# Patient Record
Sex: Female | Born: 1970 | Race: Black or African American | Hispanic: No | Marital: Single | Smoking: Current every day smoker
Health system: Southern US, Community
[De-identification: ages and names within clinical notes are randomized; demographics above are authoritative.]

## PROBLEM LIST (undated history)

## (undated) DIAGNOSIS — F419 Anxiety disorder, unspecified: Secondary | ICD-10-CM

## (undated) DIAGNOSIS — F32A Depression, unspecified: Secondary | ICD-10-CM

## (undated) DIAGNOSIS — F319 Bipolar disorder, unspecified: Secondary | ICD-10-CM

## (undated) DIAGNOSIS — F17200 Nicotine dependence, unspecified, uncomplicated: Secondary | ICD-10-CM

## (undated) DIAGNOSIS — Z8719 Personal history of other diseases of the digestive system: Secondary | ICD-10-CM

## (undated) DIAGNOSIS — I10 Essential (primary) hypertension: Secondary | ICD-10-CM

## (undated) DIAGNOSIS — F329 Major depressive disorder, single episode, unspecified: Secondary | ICD-10-CM

## (undated) DIAGNOSIS — Z9889 Other specified postprocedural states: Secondary | ICD-10-CM

## (undated) DIAGNOSIS — F29 Unspecified psychosis not due to a substance or known physiological condition: Secondary | ICD-10-CM

## (undated) DIAGNOSIS — F22 Delusional disorders: Secondary | ICD-10-CM

## (undated) HISTORY — PX: CHOLECYSTECTOMY: SHX55

## (undated) HISTORY — PX: TONSILLECTOMY: SUR1361

## (undated) HISTORY — PX: HERNIA REPAIR: SHX51

## (undated) HISTORY — PX: ANKLE SURGERY: SHX546

## (undated) HISTORY — PX: TUBAL LIGATION: SHX77

---

## 2010-07-30 ENCOUNTER — Emergency Department (HOSPITAL_COMMUNITY)
Admission: EM | Admit: 2010-07-30 | Discharge: 2010-07-30 | Disposition: A | Discharge status: Home / Self Care | Payer: Medicaid - Out of State | Attending: Emergency Medicine | Admitting: Emergency Medicine

## 2010-07-30 DIAGNOSIS — R109 Unspecified abdominal pain: Secondary | ICD-10-CM | POA: Insufficient documentation

## 2010-07-30 DIAGNOSIS — I10 Essential (primary) hypertension: Secondary | ICD-10-CM | POA: Insufficient documentation

## 2010-07-30 DIAGNOSIS — Z4801 Encounter for change or removal of surgical wound dressing: Secondary | ICD-10-CM | POA: Insufficient documentation

## 2010-11-05 ENCOUNTER — Emergency Department (HOSPITAL_COMMUNITY)
Admission: EM | Admit: 2010-11-05 | Discharge: 2010-11-06 | Disposition: A | Discharge status: Home / Self Care | Payer: Medicaid - Out of State | Attending: Emergency Medicine | Admitting: Emergency Medicine

## 2010-11-05 DIAGNOSIS — R071 Chest pain on breathing: Secondary | ICD-10-CM | POA: Insufficient documentation

## 2010-11-05 DIAGNOSIS — E669 Obesity, unspecified: Secondary | ICD-10-CM | POA: Insufficient documentation

## 2010-11-05 DIAGNOSIS — F329 Major depressive disorder, single episode, unspecified: Secondary | ICD-10-CM | POA: Insufficient documentation

## 2010-11-05 DIAGNOSIS — I10 Essential (primary) hypertension: Secondary | ICD-10-CM | POA: Insufficient documentation

## 2010-11-05 DIAGNOSIS — F17200 Nicotine dependence, unspecified, uncomplicated: Secondary | ICD-10-CM | POA: Insufficient documentation

## 2010-11-05 DIAGNOSIS — F3289 Other specified depressive episodes: Secondary | ICD-10-CM | POA: Insufficient documentation

## 2010-11-05 DIAGNOSIS — Z79899 Other long term (current) drug therapy: Secondary | ICD-10-CM | POA: Insufficient documentation

## 2010-11-05 DIAGNOSIS — F411 Generalized anxiety disorder: Secondary | ICD-10-CM | POA: Insufficient documentation

## 2010-11-05 HISTORY — DX: Personal history of other diseases of the digestive system: Z87.19

## 2010-11-05 HISTORY — DX: Other specified postprocedural states: Z98.890

## 2010-11-05 HISTORY — DX: Nicotine dependence, unspecified, uncomplicated: F17.200

## 2010-11-05 HISTORY — DX: Essential (primary) hypertension: I10

## 2010-11-05 LAB — DIFFERENTIAL
Eosinophils Absolute: 0.5 10*3/uL (ref 0.0–0.7)
Eosinophils Relative: 3 % (ref 0–5)
Lymphs Abs: 5.1 10*3/uL — ABNORMAL HIGH (ref 0.7–4.0)
Monocytes Relative: 4 % (ref 3–12)

## 2010-11-05 LAB — COMPREHENSIVE METABOLIC PANEL
Alkaline Phosphatase: 78 U/L (ref 39–117)
BUN: 13 mg/dL (ref 6–23)
CO2: 28 mEq/L (ref 19–32)
Chloride: 100 mEq/L (ref 96–112)
GFR calc Af Amer: >60 mL/min (ref >60)
Glucose, Bld: 103 mg/dL — ABNORMAL HIGH (ref 70–99)
Potassium: 3.2 mEq/L — ABNORMAL LOW (ref 3.5–5.1)
Total Bilirubin: 0.3 mg/dL (ref 0.3–1.2)

## 2010-11-05 LAB — CBC
MCH: 25.4 pg — ABNORMAL LOW (ref 26.0–34.0)
MCV: 77.6 fL — ABNORMAL LOW (ref 78.0–100.0)
Platelets: 337 10*3/uL (ref 150–400)
RDW: 15.6 % — ABNORMAL HIGH (ref 11.5–15.5)

## 2010-11-05 LAB — LIPASE, BLOOD: Lipase: 33 U/L (ref 11–59)

## 2010-11-06 ENCOUNTER — Encounter (HOSPITAL_COMMUNITY): Payer: Self-pay | Admitting: Radiology

## 2010-11-06 ENCOUNTER — Emergency Department (HOSPITAL_COMMUNITY): Payer: Medicaid - Out of State

## 2010-11-06 MED ORDER — IOHEXOL 350 MG/ML SOLN
100.0000 mL | Freq: Once | INTRAVENOUS | Status: AC | PRN
  Administered 2010-11-06: 100 mL via INTRAVENOUS

## 2010-12-03 ENCOUNTER — Other Ambulatory Visit: Payer: Self-pay | Admitting: Family Medicine

## 2010-12-03 DIAGNOSIS — D72829 Elevated white blood cell count, unspecified: Secondary | ICD-10-CM

## 2010-12-06 ENCOUNTER — Other Ambulatory Visit: Payer: Medicaid - Out of State

## 2010-12-09 ENCOUNTER — Ambulatory Visit
Admission: RE | Admit: 2010-12-09 | Discharge: 2010-12-09 | Disposition: A | Discharge status: Home / Self Care | Payer: Medicaid Other | Source: Ambulatory Visit | Attending: Family Medicine | Admitting: Family Medicine

## 2010-12-09 DIAGNOSIS — D72829 Elevated white blood cell count, unspecified: Secondary | ICD-10-CM

## 2010-12-09 MED ORDER — IOHEXOL 300 MG/ML  SOLN
125.0000 mL | Freq: Once | INTRAMUSCULAR | Status: AC | PRN
  Administered 2010-12-09: 125 mL via INTRAVENOUS

## 2012-01-27 ENCOUNTER — Emergency Department (HOSPITAL_COMMUNITY)
Admission: EM | Admit: 2012-01-27 | Discharge: 2012-01-28 | Disposition: A | Discharge status: Home / Self Care | Payer: Medicaid Other | Attending: Emergency Medicine | Admitting: Emergency Medicine

## 2012-01-27 ENCOUNTER — Emergency Department (HOSPITAL_COMMUNITY): Payer: Medicaid Other

## 2012-01-27 DIAGNOSIS — R197 Diarrhea, unspecified: Secondary | ICD-10-CM | POA: Insufficient documentation

## 2012-01-27 DIAGNOSIS — F17200 Nicotine dependence, unspecified, uncomplicated: Secondary | ICD-10-CM | POA: Insufficient documentation

## 2012-01-27 DIAGNOSIS — Z79899 Other long term (current) drug therapy: Secondary | ICD-10-CM | POA: Insufficient documentation

## 2012-01-27 DIAGNOSIS — R1033 Periumbilical pain: Secondary | ICD-10-CM | POA: Insufficient documentation

## 2012-01-27 DIAGNOSIS — I10 Essential (primary) hypertension: Secondary | ICD-10-CM | POA: Insufficient documentation

## 2012-01-27 LAB — COMPREHENSIVE METABOLIC PANEL
AST: 16 U/L (ref 0–37)
Albumin: 3.7 g/dL (ref 3.5–5.2)
Calcium: 9.9 mg/dL (ref 8.4–10.5)
Chloride: 100 mEq/L (ref 96–112)
Creatinine, Ser: 0.91 mg/dL (ref 0.50–1.10)
Total Bilirubin: 0.5 mg/dL (ref 0.3–1.2)
Total Protein: 7.5 g/dL (ref 6.0–8.3)

## 2012-01-27 LAB — CBC WITH DIFFERENTIAL/PLATELET
Basophils Absolute: 0.1 10*3/uL (ref 0.0–0.1)
Basophils Relative: 1 % (ref 0–1)
HCT: 39.4 % (ref 36.0–46.0)
MCHC: 32.5 g/dL (ref 30.0–36.0)
Monocytes Absolute: 0.9 10*3/uL (ref 0.1–1.0)
Neutro Abs: 9.9 10*3/uL — ABNORMAL HIGH (ref 1.7–7.7)
Neutrophils Relative %: 66 % (ref 43–77)
RDW: 14.8 % (ref 11.5–15.5)

## 2012-01-27 LAB — URINALYSIS, ROUTINE W REFLEX MICROSCOPIC
Glucose, UA: NEGATIVE mg/dL
Hgb urine dipstick: NEGATIVE
Specific Gravity, Urine: 1.012 (ref 1.005–1.030)
pH: 7 (ref 5.0–8.0)

## 2012-01-27 LAB — LIPASE, BLOOD: Lipase: 33 U/L (ref 11–59)

## 2012-01-27 LAB — URINE MICROSCOPIC-ADD ON

## 2012-01-27 MED ORDER — MORPHINE SULFATE 4 MG/ML IJ SOLN
6.0000 mg | Freq: Once | INTRAMUSCULAR | Status: AC
  Administered 2012-01-27: 6 mg via INTRAVENOUS
  Filled 2012-01-27: qty 2

## 2012-01-27 MED ORDER — IOHEXOL 300 MG/ML  SOLN
100.0000 mL | Freq: Once | INTRAMUSCULAR | Status: AC | PRN
  Administered 2012-01-27: 100 mL via INTRAVENOUS

## 2012-01-27 NOTE — ED Provider Notes (Signed)
History     CSN: [id number]  Arrival date & time 01/27/12  1706   First MD Initiated Contact with Patient 01/27/12 1757      Chief Complaint  Patient presents with  . Abdominal Pain    (Consider location/radiation/quality/duration/timing/severity/associated sxs/prior treatment) Patient is a 41 y.o. female presenting with abdominal pain. The history is provided by the patient.  Abdominal Pain The primary symptoms of the illness include abdominal pain and diarrhea. The primary symptoms of the illness do not include fever, shortness of breath, nausea, vomiting or dysuria.  Symptoms associated with the illness do not include back pain. Associated symptoms comments: Periumbilical abdominal pain for 2 weeks getting worse over time. No N, V at any time. She reports diarrhea that is chronic and going on for longer than the 2 weeks she is having abdominal pain. No bloody bowel movements. No fever. She reports history of multiple ventral hernia repairs. .    Past Medical History  Diagnosis Date  . Hypertension   . Active smoker   . Hx of hernia repair     No past surgical history on file.  No family history on file.  History  Substance Use Topics  . Smoking status: Not on file  . Smokeless tobacco: Not on file  . Alcohol Use: Not on file    OB History    Grav Para Term Preterm Abortions TAB SAB Ect Mult Living                  Review of Systems  Constitutional: Negative for fever.  Respiratory: Negative for shortness of breath.   Cardiovascular: Negative for chest pain.  Gastrointestinal: Positive for abdominal pain and diarrhea. Negative for nausea and vomiting.  Genitourinary: Negative for dysuria.  Musculoskeletal: Negative for back pain.    Allergies  Review of patient's allergies indicates no known allergies.  Home Medications   Current Outpatient Rx  Name Route Sig Dispense Refill  . ALPRAZOLAM 1 MG PO TABS Oral Take 1 mg by mouth.    Marland Kitchen HYDROCHLOROTHIAZIDE  12.5 MG PO CAPS Oral Take 12.5 mg by mouth daily.    Marland Kitchen LISINOPRIL 5 MG PO TABS Oral Take 5 mg by mouth every other day.    Marland Kitchen LITHIUM CARBONATE 300 MG PO CAPS Oral Take 600 mg by mouth 2 (two) times daily with a meal.      BP 113/49  Pulse 71  Temp 98.7 F (37.1 C) (Oral)  Resp 14  Ht 5' (1.524 m)  Wt 230 lb (104.327 kg)  BMI 44.92 kg/m2  SpO2 100%  Physical Exam  Constitutional: She is oriented to person, place, and time. She appears well-developed and well-nourished.  HENT:  Head: Normocephalic.  Neck: Normal range of motion. Neck supple.  Cardiovascular: Normal rate and regular rhythm.   Pulmonary/Chest: Effort normal and breath sounds normal.  Abdominal: Soft. There is tenderness. There is no rebound and no guarding.       Most tender in periumbilical abdomen with less tenderness diffusely. BS hypoactive. No palpable masses.   Musculoskeletal: Normal range of motion.  Neurological: She is alert and oriented to person, place, and time.  Skin: Skin is warm and dry. No rash noted.  Psychiatric: She has a normal mood and affect.    ED Course  Procedures (including critical care time)  Labs Reviewed  CBC WITH DIFFERENTIAL - Abnormal; Notable for the following:    WBC 15.0 (*)     Neutro Abs 9.9 (*)  All other components within normal limits  COMPREHENSIVE METABOLIC PANEL - Abnormal; Notable for the following:    Sodium 134 (*)     GFR calc non Af Amer 78 (*)     All other components within normal limits  URINALYSIS, ROUTINE W REFLEX MICROSCOPIC - Abnormal; Notable for the following:    APPearance CLOUDY (*)     Leukocytes, UA SMALL (*)     All other components within normal limits  URINE MICROSCOPIC-ADD ON   No results found.   No diagnosis found.    MDM          Rodena Medin, PA-C 01/27/12 2027

## 2012-01-27 NOTE — ED Notes (Signed)
Pt states that she has had 5 hernias/hernia repairs and a gallbladder removal and has had abd pain for 2 wks now. States it feels like hernia pain. N/V/D. Especailly when she is eating.

## 2012-01-28 MED ORDER — HYDROCODONE-ACETAMINOPHEN 5-325 MG PO TABS
1.0000 | ORAL_TABLET | Freq: Four times a day (QID) | ORAL | Status: DC | PRN
Start: 2012-01-28 — End: 2012-10-11

## 2012-01-30 NOTE — ED Provider Notes (Signed)
Medical screening examination/treatment/procedure(s) were performed by non-physician practitioner and as supervising physician I was immediately available for consultation/collaboration.   Laneisha Mino E Christabelle Hanzlik, MD 01/30/12 1641 

## 2012-10-11 ENCOUNTER — Encounter (HOSPITAL_COMMUNITY): Payer: Self-pay | Admitting: Emergency Medicine

## 2012-10-11 ENCOUNTER — Emergency Department (HOSPITAL_COMMUNITY)
Admission: EM | Admit: 2012-10-11 | Discharge: 2012-10-11 | Disposition: A | Discharge status: Home / Self Care | Payer: Medicaid Other | Attending: Emergency Medicine | Admitting: Emergency Medicine

## 2012-10-11 DIAGNOSIS — F411 Generalized anxiety disorder: Secondary | ICD-10-CM | POA: Insufficient documentation

## 2012-10-11 DIAGNOSIS — F17200 Nicotine dependence, unspecified, uncomplicated: Secondary | ICD-10-CM | POA: Insufficient documentation

## 2012-10-11 DIAGNOSIS — I10 Essential (primary) hypertension: Secondary | ICD-10-CM | POA: Insufficient documentation

## 2012-10-11 DIAGNOSIS — F319 Bipolar disorder, unspecified: Secondary | ICD-10-CM | POA: Insufficient documentation

## 2012-10-11 DIAGNOSIS — Z79899 Other long term (current) drug therapy: Secondary | ICD-10-CM | POA: Insufficient documentation

## 2012-10-11 DIAGNOSIS — Z760 Encounter for issue of repeat prescription: Secondary | ICD-10-CM | POA: Insufficient documentation

## 2012-10-11 HISTORY — DX: Bipolar disorder, unspecified: F31.9

## 2012-10-11 NOTE — ED Notes (Signed)
States that all she needs is med refill for bipolar and depression states that is all she needs no other issues

## 2012-10-11 NOTE — ED Notes (Signed)
Pt left before discharge instructions

## 2012-10-11 NOTE — ED Provider Notes (Signed)
History     CSN: [id number]  Arrival date & time 10/11/12  1132   First MD Initiated Contact with Patient 10/11/12 1243      Chief Complaint  Patient presents with  . Medication Refill    (Consider location/radiation/quality/duration/timing/severity/associated sxs/prior treatment) HPI Comments: Patient is a 42 year old female with history of bipolar disorder and anxiety who presents today for medication refill. She was being seen by a facility that has now stopped managing long-term medications. She has run out of her current lithium and Xanax prescriptions and is requesting a refill. 1 mg of Xanax 3 times daily. She otherwise feels well. She denies fever, chills, nausea, vomiting, abdominal pain, shortness of breath, chest pain.   The history is provided by the patient. No language interpreter was used.    Past Medical History  Diagnosis Date  . Hypertension   . Active smoker   . Hx of hernia repair   . Bipolar 1 disorder     No past surgical history on file.  No family history on file.  History  Substance Use Topics  . Smoking status: Current Every Day Smoker  . Smokeless tobacco: Not on file  . Alcohol Use: Yes    OB History   Grav Para Term Preterm Abortions TAB SAB Ect Mult Living                  Review of Systems  Constitutional: Negative for fever and chills.  Respiratory: Negative for shortness of breath.   Cardiovascular: Negative for chest pain.  Gastrointestinal: Negative for nausea, vomiting and abdominal pain.  Psychiatric/Behavioral: Negative for suicidal ideas. The patient is nervous/anxious.   All other systems reviewed and are negative.    Allergies  Review of patient's allergies indicates no known allergies.  Home Medications   Current Outpatient Rx  Name  Route  Sig  Dispense  Refill  . albuterol (PROVENTIL HFA;VENTOLIN HFA) 108 (90 BASE) MCG/ACT inhaler   Inhalation   Inhale 2 puffs into the lungs every 6 (six) hours as needed for  wheezing.         Marland Kitchen ALPRAZolam (XANAX) 1 MG tablet   Oral   Take 1 mg by mouth 3 (three) times daily as needed for anxiety.          Marland Kitchen guaiFENesin (MUCINEX) 600 MG 12 hr tablet   Oral   Take 1,200 mg by mouth 2 (two) times daily as needed for congestion.         . hydrochlorothiazide (MICROZIDE) 12.5 MG capsule   Oral   Take 12.5 mg by mouth daily.         Marland Kitchen lisinopril (PRINIVIL,ZESTRIL) 5 MG tablet   Oral   Take 5 mg by mouth every other day.         . lithium carbonate 300 MG capsule   Oral   Take 600 mg by mouth 2 (two) times daily with a meal.         . naproxen sodium (ANAPROX) 220 MG tablet   Oral   Take 440 mg by mouth 2 (two) times daily as needed (for pain).         . vitamin B-12 (CYANOCOBALAMIN) 1000 MCG tablet   Oral   Take 1,000 mcg by mouth daily.           BP 153/70  Pulse 75  Temp(Src) 97.6 F (36.4 C) (Oral)  Resp 18  SpO2 100%  Physical Exam  Nursing note and vitals  reviewed. Constitutional: She is oriented to person, place, and time. She appears well-developed and well-nourished. No distress.  HENT:  Head: Normocephalic and atraumatic.  Right Ear: External ear normal.  Left Ear: External ear normal.  Nose: Nose normal.  Mouth/Throat: Oropharynx is clear and moist.  Eyes: Conjunctivae are normal.  Neck: Normal range of motion.  Cardiovascular: Normal rate, regular rhythm and normal heart sounds.   Pulmonary/Chest: Effort normal and breath sounds normal. No stridor. No respiratory distress. She has no wheezes. She has no rales.  Abdominal: Soft. She exhibits no distension.  Musculoskeletal: Normal range of motion.  Neurological: She is alert and oriented to person, place, and time. She has normal strength.  Skin: Skin is warm and dry. She is not diaphoretic. No erythema.  Psychiatric: Her mood appears anxious. Her speech is rapid and/or pressured. She is agitated. She expresses no homicidal and no suicidal ideation.    ED  Course  Procedures (including critical care time)  Labs Reviewed - No data to display No results found.   1. Medication refill       MDM  Patient presents for medication refill for lithium and Xanax. Discussed that I would not provide her lithium as this is the medication and needs to be monitored, but I would give her a prescription for Xanax. She was very unhappy with this and refused the Xanax Rx. Discussed that there were facilities that she could go to including [facility] who could manage her outpatient medications. Told her to I would give her a resource guide, but she left before receiving the resource guide or discharge instructions.         Mora Bellman, PA-C 10/11/12 1542

## 2012-10-12 NOTE — ED Provider Notes (Signed)
Medical screening examination/treatment/procedure(s) were performed by non-physician practitioner and as supervising physician I was immediately available for consultation/collaboration.   Carleene Cooper III, MD 10/12/12 [phone]

## 2012-11-29 ENCOUNTER — Emergency Department (HOSPITAL_COMMUNITY)
Admission: EM | Admit: 2012-11-29 | Discharge: 2012-11-29 | Disposition: A | Discharge status: Home / Self Care | Payer: Medicaid Other | Attending: Emergency Medicine | Admitting: Emergency Medicine

## 2012-11-29 ENCOUNTER — Emergency Department (HOSPITAL_COMMUNITY): Payer: Medicaid Other

## 2012-11-29 ENCOUNTER — Encounter (HOSPITAL_COMMUNITY): Payer: Self-pay | Admitting: *Deleted

## 2012-11-29 DIAGNOSIS — I10 Essential (primary) hypertension: Secondary | ICD-10-CM | POA: Insufficient documentation

## 2012-11-29 DIAGNOSIS — R0789 Other chest pain: Secondary | ICD-10-CM | POA: Insufficient documentation

## 2012-11-29 DIAGNOSIS — F17200 Nicotine dependence, unspecified, uncomplicated: Secondary | ICD-10-CM | POA: Insufficient documentation

## 2012-11-29 DIAGNOSIS — F411 Generalized anxiety disorder: Secondary | ICD-10-CM | POA: Insufficient documentation

## 2012-11-29 DIAGNOSIS — Z862 Personal history of diseases of the blood and blood-forming organs and certain disorders involving the immune mechanism: Secondary | ICD-10-CM | POA: Insufficient documentation

## 2012-11-29 DIAGNOSIS — R079 Chest pain, unspecified: Secondary | ICD-10-CM

## 2012-11-29 DIAGNOSIS — F319 Bipolar disorder, unspecified: Secondary | ICD-10-CM | POA: Insufficient documentation

## 2012-11-29 DIAGNOSIS — Z8639 Personal history of other endocrine, nutritional and metabolic disease: Secondary | ICD-10-CM | POA: Insufficient documentation

## 2012-11-29 DIAGNOSIS — Z760 Encounter for issue of repeat prescription: Secondary | ICD-10-CM | POA: Insufficient documentation

## 2012-11-29 DIAGNOSIS — Z79899 Other long term (current) drug therapy: Secondary | ICD-10-CM | POA: Insufficient documentation

## 2012-11-29 LAB — URINALYSIS, ROUTINE W REFLEX MICROSCOPIC
Bilirubin Urine: NEGATIVE
Glucose, UA: NEGATIVE mg/dL
Ketones, ur: NEGATIVE mg/dL
Protein, ur: NEGATIVE mg/dL
Urobilinogen, UA: 1 mg/dL (ref 0.0–1.0)

## 2012-11-29 LAB — POCT I-STAT, CHEM 8
BUN: 9 mg/dL (ref 6–23)
Calcium, Ion: 1.24 mmol/L — ABNORMAL HIGH (ref 1.12–1.23)
Creatinine, Ser: 1 mg/dL (ref 0.50–1.10)
Glucose, Bld: 92 mg/dL (ref 70–99)
Hemoglobin: 14.3 g/dL (ref 12.0–15.0)
TCO2: 25 mmol/L (ref 0–100)

## 2012-11-29 LAB — CBC
HCT: 39.9 % (ref 36.0–46.0)
Hemoglobin: 13.3 g/dL (ref 12.0–15.0)
MCH: 27.4 pg (ref 26.0–34.0)
MCHC: 33.3 g/dL (ref 30.0–36.0)
MCV: 82.1 fL (ref 78.0–100.0)
RDW: 15.1 % (ref 11.5–15.5)

## 2012-11-29 LAB — URINE MICROSCOPIC-ADD ON

## 2012-11-29 LAB — POCT I-STAT TROPONIN I

## 2012-11-29 MED ORDER — LISINOPRIL 5 MG PO TABS: 5.0000 mg | ORAL_TABLET | ORAL | Status: DC

## 2012-11-29 MED ORDER — HYDROCODONE-ACETAMINOPHEN 5-325 MG PO TABS
1.0000 | ORAL_TABLET | Freq: Once | ORAL | Status: DC
  Filled 2012-11-29: qty 1

## 2012-11-29 MED ORDER — IBUPROFEN 200 MG PO TABS
600.0000 mg | ORAL_TABLET | Freq: Once | ORAL | Status: AC
  Administered 2012-11-29: 600 mg via ORAL
  Filled 2012-11-29: qty 3

## 2012-11-29 NOTE — ED Provider Notes (Signed)
CSN: [id number]     Arrival date & time 11/29/12  1738 History     First MD Initiated Contact with Patient 11/29/12 2042     Chief Complaint  Patient presents with  . Chest Pain  . Anxiety  . Medication Refill    HPI Patient reports right-sided chest discomfort that's been constant over the past 24 hours.  Some of his radiates towards the right shoulder.  No recent fall or trauma.  No shortness of breath.  No nausea vomiting or diarrhea.  She has no known history of cardiac disease.  She continues to smoke cigarettes.  She has a history of hypertension and hyperlipidemia.  No history of diabetes.  She reports no urinary symptoms.  No melena or hematochezia.  She reports her pain is worse with movement of her right arm.  No recent long travel or surgery.  Her symptoms are mild in severity.  No cough or Past Medical History  Diagnosis Date  . Hypertension   . Active smoker   . Hx of hernia repair   . Bipolar 1 disorder    Past Surgical History  Procedure Laterality Date  . Cholecystectomy     History reviewed. No pertinent family history. History  Substance Use Topics  . Smoking status: Current Every Day Smoker -- 0.50 packs/day    Types: Cigarettes  . Smokeless tobacco: Not on file  . Alcohol Use: Yes     Comment: occasionally   OB History   Grav Para Term Preterm Abortions TAB SAB Ect Mult Living                 Review of Systems  All other systems reviewed and are negative.    Allergies  Review of patient's allergies indicates no known allergies.  Home Medications   Current Outpatient Rx  Name  Route  Sig  Dispense  Refill  . albuterol (PROVENTIL HFA;VENTOLIN HFA) 108 (90 BASE) MCG/ACT inhaler   Inhalation   Inhale 2 puffs into the lungs every 6 (six) hours as needed for wheezing or shortness of breath.          . ALPRAZolam (XANAX) 1 MG tablet   Oral   Take 1 mg by mouth 3 (three) times daily as needed for anxiety.          Marland Kitchen guaiFENesin (MUCINEX) 600  MG 12 hr tablet   Oral   Take 1,200 mg by mouth 2 (two) times daily as needed for congestion.         . hydrochlorothiazide (MICROZIDE) 12.5 MG capsule   Oral   Take 12.5 mg by mouth daily.         Marland Kitchen lithium carbonate 300 MG capsule   Oral   Take 600 mg by mouth 2 (two) times daily with a meal.         . naproxen sodium (ANAPROX) 220 MG tablet   Oral   Take 440 mg by mouth 2 (two) times daily as needed (for pain).         . vitamin B-12 (CYANOCOBALAMIN) 1000 MCG tablet   Oral   Take 1,000 mcg by mouth daily.         Marland Kitchen lisinopril (PRINIVIL,ZESTRIL) 5 MG tablet   Oral   Take 1 tablet (5 mg total) by mouth every other day.   30 tablet   3    BP 142/74  Pulse 75  Temp(Src) 98.4 F (36.9 C) (Oral)  Resp 28  Ht 5' (  1.524 m)  Wt 254 lb (115.214 kg)  BMI 49.61 kg/m2  SpO2 100%  LMP 11/26/2012 Physical Exam  Nursing note and vitals reviewed. Constitutional: She is oriented to person, place, and time. She appears well-developed and well-nourished. No distress.  HENT:  Head: Normocephalic and atraumatic.  Eyes: EOM are normal.  Neck: Normal range of motion.  Cardiovascular: Normal rate, regular rhythm and normal heart sounds.   Pulmonary/Chest: Effort normal and breath sounds normal.  Mild tenderness right anterior chest wall.  No rash noted.  Abdominal: Soft. She exhibits no distension. There is no tenderness.  Musculoskeletal: Normal range of motion.  Neurological: She is alert and oriented to person, place, and time.  Skin: Skin is warm and dry.  Psychiatric: She has a normal mood and affect. Judgment normal.    ED Course   Procedures (including critical care time)   Date: 11/29/2012  Rate: 77  Rhythm: normal sinus rhythm  QRS Axis: normal  Intervals: normal  ST/T Wave abnormalities: Nonspecific T wave changes  Conduction Disutrbances: none  Narrative Interpretation:   Old EKG Reviewed: No significant changes noted     Labs Reviewed  CBC -  Abnormal; Notable for the following:    WBC 15.8 (*)    All other components within normal limits  URINALYSIS, ROUTINE W REFLEX MICROSCOPIC - Abnormal; Notable for the following:    APPearance CLOUDY (*)    Hgb urine dipstick LARGE (*)    Leukocytes, UA MODERATE (*)    All other components within normal limits  URINE MICROSCOPIC-ADD ON - Abnormal; Notable for the following:    Squamous Epithelial / LPF FEW (*)    Bacteria, UA FEW (*)    All other components within normal limits  POCT I-STAT, CHEM 8 - Abnormal; Notable for the following:    Calcium, Ion 1.24 (*)    All other components within normal limits  URINE CULTURE  POCT I-STAT TROPONIN I   Dg Chest 2 View  11/29/2012   *RADIOLOGY REPORT*  Clinical Data: Chest pain.  CHEST - 2 VIEW  Comparison: Chest CT, 11/06/2010  Findings: The heart, mediastinum and hila are within normal limits. The lungs are clear.  No pleural effusion or pneumothorax.  The bony thorax and surrounding soft tissues are unremarkable.  IMPRESSION: No active disease of the chest.   Original Report Authenticated By: Amie Portland, M.D.   1. Chest pain     MDM  9:42 PM This is well-appearing.  Discharge home in good condition.  Doubt ACS.  Doubt PE.  Likely musculoskeletal.  Lyanne Co, MD 11/29/12 2147

## 2012-11-29 NOTE — ED Notes (Signed)
Pt with hx of cp r/t anxiety to ED c/o sternal chest pain and dizziness that began at 1 pm.  Denies sob.  States she is on anxiety meds but the stress in her life is increasing and the meds are not helping.  She would also like a refill of her bp meds.

## 2012-11-29 NOTE — ED Notes (Signed)
Patient states her chest pain increases with inspiration. Denies shortness of breath, diaphoresis or nausea with the pain.

## 2012-12-01 LAB — URINE CULTURE: Colony Count: 65000

## 2012-12-03 ENCOUNTER — Emergency Department (HOSPITAL_COMMUNITY): Payer: Medicaid Other

## 2012-12-03 ENCOUNTER — Encounter (HOSPITAL_COMMUNITY): Payer: Self-pay | Admitting: Emergency Medicine

## 2012-12-03 ENCOUNTER — Emergency Department (HOSPITAL_COMMUNITY)
Admission: EM | Admit: 2012-12-03 | Discharge: 2012-12-03 | Disposition: A | Discharge status: Home / Self Care | Payer: Medicaid Other | Attending: Emergency Medicine | Admitting: Emergency Medicine

## 2012-12-03 DIAGNOSIS — Z3202 Encounter for pregnancy test, result negative: Secondary | ICD-10-CM | POA: Insufficient documentation

## 2012-12-03 DIAGNOSIS — F17200 Nicotine dependence, unspecified, uncomplicated: Secondary | ICD-10-CM | POA: Insufficient documentation

## 2012-12-03 DIAGNOSIS — Z9889 Other specified postprocedural states: Secondary | ICD-10-CM | POA: Insufficient documentation

## 2012-12-03 DIAGNOSIS — Z79899 Other long term (current) drug therapy: Secondary | ICD-10-CM | POA: Insufficient documentation

## 2012-12-03 DIAGNOSIS — Z8659 Personal history of other mental and behavioral disorders: Secondary | ICD-10-CM | POA: Insufficient documentation

## 2012-12-03 DIAGNOSIS — F419 Anxiety disorder, unspecified: Secondary | ICD-10-CM

## 2012-12-03 DIAGNOSIS — I10 Essential (primary) hypertension: Secondary | ICD-10-CM | POA: Insufficient documentation

## 2012-12-03 DIAGNOSIS — R059 Cough, unspecified: Secondary | ICD-10-CM | POA: Insufficient documentation

## 2012-12-03 DIAGNOSIS — R05 Cough: Secondary | ICD-10-CM | POA: Insufficient documentation

## 2012-12-03 DIAGNOSIS — R079 Chest pain, unspecified: Secondary | ICD-10-CM | POA: Insufficient documentation

## 2012-12-03 DIAGNOSIS — F411 Generalized anxiety disorder: Secondary | ICD-10-CM | POA: Insufficient documentation

## 2012-12-03 LAB — POCT I-STAT TROPONIN I: Troponin i, poc: 0 ng/mL (ref 0.00–0.08)

## 2012-12-03 LAB — POCT I-STAT, CHEM 8
BUN: 11 mg/dL (ref 6–23)
Calcium, Ion: 1.26 mmol/L — ABNORMAL HIGH (ref 1.12–1.23)
Chloride: 105 mEq/L (ref 96–112)
Creatinine, Ser: 1 mg/dL (ref 0.50–1.10)
Sodium: 137 mEq/L (ref 135–145)

## 2012-12-03 LAB — PREGNANCY, URINE: Preg Test, Ur: NEGATIVE

## 2012-12-03 LAB — URINALYSIS, DIPSTICK ONLY
Bilirubin Urine: NEGATIVE
Glucose, UA: NEGATIVE mg/dL
Protein, ur: NEGATIVE mg/dL
Urobilinogen, UA: 0.2 mg/dL (ref 0.0–1.0)

## 2012-12-03 MED ORDER — PREDNISONE 20 MG PO TABS
60.0000 mg | ORAL_TABLET | Freq: Once | ORAL | Status: AC
  Administered 2012-12-03: 60 mg via ORAL
  Filled 2012-12-03: qty 3

## 2012-12-03 MED ORDER — ALBUTEROL SULFATE HFA 108 (90 BASE) MCG/ACT IN AERS
2.0000 | INHALATION_SPRAY | Freq: Once | RESPIRATORY_TRACT | Status: AC
  Administered 2012-12-03: 2 via RESPIRATORY_TRACT
  Filled 2012-12-03: qty 6.7

## 2012-12-03 NOTE — ED Notes (Signed)
Stress at home and just moved here a few months ago. Since mon Intermit chest pain when having anxiety, pt also has a cough. Was seen at [facility] on mon for the same.

## 2012-12-03 NOTE — ED Notes (Signed)
Pt was in triage painting her finger nails.

## 2012-12-03 NOTE — ED Provider Notes (Signed)
CSN: [id number]     Arrival date & time 12/03/12  1337 History     First MD Initiated Contact with Patient 12/03/12 1343     Chief Complaint  Patient presents with  . Anxiety   (Consider location/radiation/quality/duration/timing/severity/associated sxs/prior Treatment) HPI Comments: Patient is a 42 yo F PMHx significant for HTN, Anxiety, Bipolar 1 Disorder presenting to the ED for three days of moderate right sided constant chest pain that is worsened with cough or deep inspiration. Pt states she has now developed a non-productive cough today. No alleviating or aggravating factors. Pt was seen three days ago in ED for initial onset of these symptoms with negative work up. PERC negative.    Past Medical History  Diagnosis Date  . Hypertension   . Active smoker   . Hx of hernia repair   . Bipolar 1 disorder    Past Surgical History  Procedure Laterality Date  . Cholecystectomy     No family history on file. History  Substance Use Topics  . Smoking status: Current Every Day Smoker -- 0.50 packs/day    Types: Cigarettes  . Smokeless tobacco: Not on file  . Alcohol Use: Yes     Comment: occasionally   OB History   Grav Para Term Preterm Abortions TAB SAB Ect Mult Living                 Review of Systems  Constitutional: Negative for fever and chills.  Respiratory: Positive for cough.   Cardiovascular: Positive for chest pain. Negative for palpitations and leg swelling.  Gastrointestinal: Negative for nausea and vomiting.  All other systems reviewed and are negative.    Allergies  Review of patient's allergies indicates no known allergies.  Home Medications   Current Outpatient Rx  Name  Route  Sig  Dispense  Refill  . albuterol (PROVENTIL HFA;VENTOLIN HFA) 108 (90 BASE) MCG/ACT inhaler   Inhalation   Inhale 2 puffs into the lungs every 6 (six) hours as needed for wheezing or shortness of breath.          . ALPRAZolam (XANAX) 0.5 MG tablet   Oral   Take 0.5 mg  by mouth 3 (three) times daily as needed for sleep.         . hydrochlorothiazide (MICROZIDE) 12.5 MG capsule   Oral   Take 12.5 mg by mouth daily.         Marland Kitchen lisinopril (PRINIVIL,ZESTRIL) 5 MG tablet   Oral   Take 1 tablet (5 mg total) by mouth every other day.   30 tablet   3   . lithium carbonate (ESKALITH) 450 MG CR tablet   Oral   Take 450 mg by mouth 2 (two) times daily.         . naproxen sodium (ANAPROX) 220 MG tablet   Oral   Take 440 mg by mouth 2 (two) times daily as needed (for pain).         . vitamin B-12 (CYANOCOBALAMIN) 1000 MCG tablet   Oral   Take 1,000 mcg by mouth daily.          BP 136/79  Pulse 69  Temp(Src) 98.4 F (36.9 C) (Oral)  Resp 18  SpO2 94%  LMP 11/26/2012 Physical Exam  Constitutional: She is oriented to person, place, and time. She appears well-developed and well-nourished.  HENT:  Head: Normocephalic and atraumatic.  Mouth/Throat: Oropharynx is clear and moist.  Eyes: Conjunctivae are normal.  Neck: Neck supple.  Cardiovascular:  Normal rate, regular rhythm, normal heart sounds and intact distal pulses.   Pulmonary/Chest: Effort normal and breath sounds normal.  Abdominal: Soft. There is no tenderness.  Musculoskeletal: She exhibits no edema.  Neurological: She is alert and oriented to person, place, and time.  Skin: Skin is warm and dry.    ED Course   Procedures (including critical care time)   Date: 12/03/2012  Rate: 82  Rhythm: normal sinus rhythm  QRS Axis: normal  Intervals: normal  ST/T Wave abnormalities: nonspecific T wave changes  Conduction Disutrbances:none  Narrative Interpretation:   Old EKG Reviewed: unchanged   Labs Reviewed  URINALYSIS, DIPSTICK ONLY - Abnormal; Notable for the following:    Hgb urine dipstick TRACE (*)    Leukocytes, UA LARGE (*)    All other components within normal limits  POCT I-STAT, CHEM 8 - Abnormal; Notable for the following:    Calcium, Ion 1.26 (*)    All other  components within normal limits  PREGNANCY, URINE  POCT I-STAT TROPONIN I   Dg Chest 2 View  12/03/2012   *RADIOLOGY REPORT*  Clinical Data: 42 year old female with chest pain shortness of breath and cough.  CHEST - 2 VIEW  Comparison: 11/29/2012 and earlier.  Findings: Stable to slightly lower lung volumes.  Mild increased interstitial markings.  No pneumothorax, pleural effusion or confluent pulmonary opacity.  Cardiac size and mediastinal contours are within normal limits.  Visualized tracheal air column is within normal limits.  No acute osseous abnormality identified.  IMPRESSION: Mildly increased interstitial markings, favor atelectasis due to mildly lower lung volumes.  Otherwise consider viral / atypical respiratory infection.   Original Report Authenticated By: Erskine Speed, M.D.   1. Chest pain   2. Anxiety     MDM  This is well-appearing. Discharge home in good condition. Doubt ACS. Doubt PE. Pt CXR negative for acute infiltrate. Patients symptoms are consistent with URI, likely viral etiology. Discussed that antibiotics are not indicated for viral infections. Pt will be discharged with symptomatic treatment. Also discussed anxiety component of symptoms with patient. Pt advised to f/u with her PCP regarding her increased stress and inefficiency of current Xanax regimen. Verbalizes understanding and is agreeable with plan. Pt is hemodynamically stable & in NAD prior to dc.      Jeannetta Ellis, PA-C 12/03/12 2019

## 2012-12-05 ENCOUNTER — Encounter (HOSPITAL_COMMUNITY): Payer: Self-pay | Admitting: *Deleted

## 2012-12-05 ENCOUNTER — Emergency Department (HOSPITAL_COMMUNITY)
Admission: EM | Admit: 2012-12-05 | Discharge: 2012-12-06 | Disposition: A | Discharge status: Other Acute Inpatient Hospital | Payer: Medicaid Other | Attending: Emergency Medicine | Admitting: Emergency Medicine

## 2012-12-05 DIAGNOSIS — I10 Essential (primary) hypertension: Secondary | ICD-10-CM | POA: Insufficient documentation

## 2012-12-05 DIAGNOSIS — Z79899 Other long term (current) drug therapy: Secondary | ICD-10-CM | POA: Insufficient documentation

## 2012-12-05 DIAGNOSIS — Z8719 Personal history of other diseases of the digestive system: Secondary | ICD-10-CM | POA: Insufficient documentation

## 2012-12-05 DIAGNOSIS — F319 Bipolar disorder, unspecified: Secondary | ICD-10-CM | POA: Insufficient documentation

## 2012-12-05 DIAGNOSIS — Z7982 Long term (current) use of aspirin: Secondary | ICD-10-CM | POA: Insufficient documentation

## 2012-12-05 DIAGNOSIS — E669 Obesity, unspecified: Secondary | ICD-10-CM | POA: Insufficient documentation

## 2012-12-05 DIAGNOSIS — IMO0002 Reserved for concepts with insufficient information to code with codable children: Secondary | ICD-10-CM | POA: Insufficient documentation

## 2012-12-05 DIAGNOSIS — F911 Conduct disorder, childhood-onset type: Secondary | ICD-10-CM | POA: Insufficient documentation

## 2012-12-05 DIAGNOSIS — F17200 Nicotine dependence, unspecified, uncomplicated: Secondary | ICD-10-CM | POA: Insufficient documentation

## 2012-12-05 LAB — POCT I-STAT, CHEM 8
BUN: 10 mg/dL (ref 6–23)
Calcium, Ion: 1.23 mmol/L (ref 1.12–1.23)
Creatinine, Ser: 1 mg/dL (ref 0.50–1.10)
Glucose, Bld: 97 mg/dL (ref 70–99)
Hemoglobin: 13.6 g/dL (ref 12.0–15.0)
TCO2: 24 mmol/L (ref 0–100)

## 2012-12-05 MED ORDER — ASPIRIN 325 MG PO TABS: 325.0000 mg | ORAL_TABLET | Freq: Every day | ORAL | Status: DC

## 2012-12-05 MED ORDER — NICOTINE 21 MG/24HR TD PT24
21.0000 mg | MEDICATED_PATCH | Freq: Every day | TRANSDERMAL | Status: DC
  Administered 2012-12-05: 21 mg via TRANSDERMAL
  Filled 2012-12-05: qty 1

## 2012-12-05 MED ORDER — ALBUTEROL SULFATE HFA 108 (90 BASE) MCG/ACT IN AERS: 2.0000 | INHALATION_SPRAY | Freq: Four times a day (QID) | RESPIRATORY_TRACT | Status: DC | PRN

## 2012-12-05 MED ORDER — TRAZODONE HCL 50 MG PO TABS: 50.0000 mg | ORAL_TABLET | Freq: Every evening | ORAL | Status: DC | PRN

## 2012-12-05 MED ORDER — ALUM & MAG HYDROXIDE-SIMETH 200-200-20 MG/5ML PO SUSP: 30.0000 mL | ORAL | Status: DC | PRN

## 2012-12-05 MED ORDER — HYDROCHLOROTHIAZIDE 12.5 MG PO CAPS: 12.5000 mg | ORAL_CAPSULE | Freq: Every day | ORAL | Status: DC

## 2012-12-05 MED ORDER — ALPRAZOLAM 0.25 MG PO TABS: 0.5000 mg | ORAL_TABLET | Freq: Three times a day (TID) | ORAL | Status: DC | PRN

## 2012-12-05 MED ORDER — NICOTINE 21 MG/24HR TD PT24: 21.0000 mg | MEDICATED_PATCH | Freq: Every day | TRANSDERMAL | Status: DC

## 2012-12-05 MED ORDER — LITHIUM CARBONATE ER 450 MG PO TBCR
450.0000 mg | EXTENDED_RELEASE_TABLET | Freq: Two times a day (BID) | ORAL | Status: DC
  Administered 2012-12-05: 450 mg via ORAL
  Filled 2012-12-05: qty 1

## 2012-12-05 MED ORDER — MAGNESIUM HYDROXIDE 400 MG/5ML PO SUSP: 30.0000 mL | Freq: Every day | ORAL | Status: DC | PRN

## 2012-12-05 MED ORDER — LISINOPRIL 10 MG PO TABS: 5.0000 mg | ORAL_TABLET | ORAL | Status: DC

## 2012-12-05 MED ORDER — ACETAMINOPHEN 325 MG PO TABS: 650.0000 mg | ORAL_TABLET | Freq: Four times a day (QID) | ORAL | Status: DC | PRN

## 2012-12-05 NOTE — ED Notes (Signed)
Sitter at bedside.

## 2012-12-05 NOTE — ED Notes (Signed)
Pt states that her job is very overwhelming and it is overpowering her medications (lithium and xanax) and the medications need to be changed to help with this.

## 2012-12-05 NOTE — ED Provider Notes (Signed)
CSN: [id number]     Arrival date & time 12/05/12  2004 History     First MD Initiated Contact with Patient 12/05/12 2051     Chief Complaint  Patient presents with  . Medication Management    bipolar disorder   (Consider location/radiation/quality/duration/timing/severity/associated sxs/prior Treatment) HPI Patient reports she feels that her medications are "off balance" she states that she cannot think clearly. She is experiencing Dr. Aldona Bar can harm another individual however will not elaborate in the room she wants to hurt. States her medications were adjusted 2 weeks ago. Currently on lithium and Xanax for bipolar disorder. Denies suicidal ideation. Past Medical History  Diagnosis Date  . Hypertension   . Active smoker   . Hx of hernia repair   . Bipolar 1 disorder    Past Surgical History  Procedure Laterality Date  . Cholecystectomy    . Hernia repair      4   . Ankle surgery      2 right ankle  . Tonsillectomy    . Tubal ligation     History reviewed. No pertinent family history. History  Substance Use Topics  . Smoking status: Current Every Day Smoker -- 0.50 packs/day    Types: Cigarettes  . Smokeless tobacco: Not on file  . Alcohol Use: Yes     Comment: occasionally   OB History   Grav Para Term Preterm Abortions TAB SAB Ect Mult Living                 Review of Systems  Psychiatric/Behavioral: Positive for agitation.       Feels angry, wishing to harm another individual.    Allergies  Review of patient's allergies indicates no known allergies.  Home Medications   Current Outpatient Rx  Name  Route  Sig  Dispense  Refill  . albuterol (PROVENTIL HFA;VENTOLIN HFA) 108 (90 BASE) MCG/ACT inhaler   Inhalation   Inhale 2 puffs into the lungs every 6 (six) hours as needed for wheezing or shortness of breath.          . ALPRAZolam (XANAX) 0.5 MG tablet   Oral   Take 0.5 mg by mouth 3 (three) times daily as needed for anxiety.          Marland Kitchen aspirin 325  MG tablet   Oral   Take 325 mg by mouth daily.         . Cyanocobalamin (B-12 SL)   Sublingual   Place 1 drop under the tongue daily.         . hydrochlorothiazide (MICROZIDE) 12.5 MG capsule   Oral   Take 12.5 mg by mouth daily.         Marland Kitchen lisinopril (PRINIVIL,ZESTRIL) 5 MG tablet   Oral   Take 1 tablet (5 mg total) by mouth every other day.   30 tablet   3   . lithium carbonate (ESKALITH) 450 MG CR tablet   Oral   Take 450 mg by mouth 2 (two) times daily.         . naproxen sodium (ANAPROX) 220 MG tablet   Oral   Take 440 mg by mouth 2 (two) times daily as needed (for pain).          BP 143/69  Pulse 100  Temp(Src) 97.5 F (36.4 C) (Oral)  Resp 20  SpO2 100%  LMP 11/26/2012 Physical Exam  Nursing note and vitals reviewed. Constitutional: She is oriented to person, place, and time. She appears  well-developed and well-nourished. No distress.  HENT:  Head: Normocephalic and atraumatic.  Eyes: Conjunctivae are normal. Pupils are equal, round, and reactive to light.  Neck: Neck supple. No tracheal deviation present. No thyromegaly present.  Cardiovascular: Normal rate and regular rhythm.   No murmur heard. Pulmonary/Chest: Effort normal and breath sounds normal.  Abdominal: Soft. Bowel sounds are normal. She exhibits no distension. There is no tenderness.  Obese  Musculoskeletal: Normal range of motion. She exhibits no edema and no tenderness.  Neurological: She is alert and oriented to person, place, and time. Coordination normal.  Gait normal  Skin: Skin is warm and dry. No rash noted.  Psychiatric:  Cooperative alert Glasgow Coma Score 15    ED Course   Procedures (including critical care time)  Labs Reviewed  LITHIUM LEVEL   No results found. No diagnosis found.  1145PM patient remains alert ambulatory Glasgow Coma Score 15. Results for orders placed during the hospital encounter of 12/05/12  LITHIUM LEVEL      Result Value Range   Lithium  Lvl 0.76 (*) 0.80 - 1.40 mEq/L  POCT I-STAT, CHEM 8      Result Value Range   Sodium 139  135 - 145 mEq/L   Potassium 3.7  3.5 - 5.1 mEq/L   Chloride 104  96 - 112 mEq/L   BUN 10  6 - 23 mg/dL   Creatinine, Ser 0.86  0.50 - 1.10 mg/dL   Glucose, Bld 97  70 - 99 mg/dL   Calcium, Ion 5.78  4.69 - 1.23 mmol/L   TCO2 24  0 - 100 mmol/L   Hemoglobin 13.6  12.0 - 15.0 g/dL   HCT 62.9  52.8 - 41.3 %   Dg Chest 2 View  12/03/2012   *RADIOLOGY REPORT*  Clinical Data: 42 year old female with chest pain shortness of breath and cough.  CHEST - 2 VIEW  Comparison: 11/29/2012 and earlier.  Findings: Stable to slightly lower lung volumes.  Mild increased interstitial markings.  No pneumothorax, pleural effusion or confluent pulmonary opacity.  Cardiac size and mediastinal contours are within normal limits.  Visualized tracheal air column is within normal limits.  No acute osseous abnormality identified.  IMPRESSION: Mildly increased interstitial markings, favor atelectasis due to mildly lower lung volumes.  Otherwise consider viral / atypical respiratory infection.   Original Report Authenticated By: Erskine Speed, M.D.   Dg Chest 2 View  11/29/2012   *RADIOLOGY REPORT*  Clinical Data: Chest pain.  CHEST - 2 VIEW  Comparison: Chest CT, 11/06/2010  Findings: The heart, mediastinum and hila are within normal limits. The lungs are clear.  No pleural effusion or pneumothorax.  The bony thorax and surrounding soft tissues are unremarkable.  IMPRESSION: No active disease of the chest.   Original Report Authenticated By: Amie Portland, M.D.     MDM  I feel the patient requires psychiatric inpatient admission she states she has the desire to harm other individuals. Her medications need to be adjusted. She is potentially a harm to others. Plan transfer behavioral health hospital Diagnosis bipolar disorder  Doug Sou, MD 12/05/12 2356

## 2012-12-05 NOTE — ED Notes (Addendum)
Pt states that she is on bipolar medications lithium. Pt states that she took her normal dose this morning but she needs more management of her bipolar disorder. Pt states she can't stay focused, irritated and her mood swings are more pronounced even with her regular dose. Pt denies SI or HI thoughts.
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
## 2012-12-05 NOTE — ED Notes (Signed)
Pt placed in paper scrubs and belonging placed in patient belonging bag. Research scientist (physical sciences) within sight. Pt given warm blankets, pillow and [place] sandwich and water.

## 2012-12-05 NOTE — BH Assessment (Signed)
Maryjean Morn, PA reviewed clinical information and accepted Pt to [facility] under the service of Dr. Mervyn Gay, room [phone]. Notified Sacaton Cellar, RN and Dr. Alden Server of acceptance.  Harlin Rain Patsy Baltimore, LPC, [facility] Assessment Counselor

## 2012-12-06 ENCOUNTER — Encounter (HOSPITAL_COMMUNITY): Payer: Self-pay | Admitting: Behavioral Health

## 2012-12-06 ENCOUNTER — Inpatient Hospital Stay (HOSPITAL_COMMUNITY)
Admission: AD | Admit: 2012-12-06 | Discharge: 2012-12-09 | DRG: 885 | Disposition: A | Discharge status: Home / Self Care | Payer: Medicaid Other | Source: Intra-hospital | Attending: Psychiatry | Admitting: Psychiatry

## 2012-12-06 DIAGNOSIS — F3163 Bipolar disorder, current episode mixed, severe, without psychotic features: Secondary | ICD-10-CM

## 2012-12-06 DIAGNOSIS — Z79899 Other long term (current) drug therapy: Secondary | ICD-10-CM

## 2012-12-06 DIAGNOSIS — F17200 Nicotine dependence, unspecified, uncomplicated: Secondary | ICD-10-CM | POA: Diagnosis present

## 2012-12-06 DIAGNOSIS — F3110 Bipolar disorder, current episode manic without psychotic features, unspecified: Principal | ICD-10-CM

## 2012-12-06 DIAGNOSIS — F3160 Bipolar disorder, current episode mixed, unspecified: Secondary | ICD-10-CM

## 2012-12-06 DIAGNOSIS — R45850 Homicidal ideations: Secondary | ICD-10-CM

## 2012-12-06 DIAGNOSIS — I10 Essential (primary) hypertension: Secondary | ICD-10-CM | POA: Diagnosis present

## 2012-12-06 MED ORDER — LITHIUM CARBONATE ER 450 MG PO TBCR
450.0000 mg | EXTENDED_RELEASE_TABLET | Freq: Two times a day (BID) | ORAL | Status: DC
  Administered 2012-12-06: 450 mg via ORAL
  Filled 2012-12-06 (×6): qty 1

## 2012-12-06 MED ORDER — ALPRAZOLAM 0.5 MG PO TABS
0.5000 mg | ORAL_TABLET | Freq: Two times a day (BID) | ORAL | Status: DC | PRN
  Administered 2012-12-06 – 2012-12-08 (×5): 0.5 mg via ORAL
  Filled 2012-12-06 (×5): qty 1

## 2012-12-06 MED ORDER — ACETAMINOPHEN 325 MG PO TABS: 650.0000 mg | ORAL_TABLET | Freq: Four times a day (QID) | ORAL | Status: DC | PRN

## 2012-12-06 MED ORDER — NICOTINE 21 MG/24HR TD PT24
21.0000 mg | MEDICATED_PATCH | Freq: Every day | TRANSDERMAL | Status: DC
  Administered 2012-12-06 – 2012-12-08 (×3): 21 mg via TRANSDERMAL
  Filled 2012-12-06 (×6): qty 1

## 2012-12-06 MED ORDER — ALBUTEROL SULFATE HFA 108 (90 BASE) MCG/ACT IN AERS
2.0000 | INHALATION_SPRAY | Freq: Four times a day (QID) | RESPIRATORY_TRACT | Status: DC | PRN
  Administered 2012-12-07 (×2): 2 via RESPIRATORY_TRACT
  Filled 2012-12-06: qty 6.7

## 2012-12-06 MED ORDER — HYDROCHLOROTHIAZIDE 12.5 MG PO CAPS
12.5000 mg | ORAL_CAPSULE | Freq: Every day | ORAL | Status: DC
  Administered 2012-12-06 – 2012-12-09 (×4): 12.5 mg via ORAL
  Filled 2012-12-06 (×7): qty 1

## 2012-12-06 MED ORDER — LITHIUM CARBONATE ER 300 MG PO TBCR
600.0000 mg | EXTENDED_RELEASE_TABLET | Freq: Two times a day (BID) | ORAL | Status: DC
  Administered 2012-12-06 – 2012-12-09 (×6): 600 mg via ORAL
  Filled 2012-12-06 (×3): qty 2
  Filled 2012-12-06: qty 12
  Filled 2012-12-06 (×2): qty 2
  Filled 2012-12-06: qty 12
  Filled 2012-12-06 (×5): qty 2

## 2012-12-06 MED ORDER — ASPIRIN EC 325 MG PO TBEC
325.0000 mg | DELAYED_RELEASE_TABLET | Freq: Every day | ORAL | Status: DC
  Administered 2012-12-06 – 2012-12-09 (×4): 325 mg via ORAL
  Filled 2012-12-06 (×7): qty 1

## 2012-12-06 MED ORDER — BUPROPION HCL ER (SR) 150 MG PO TB12
150.0000 mg | ORAL_TABLET | Freq: Every day | ORAL | Status: DC
  Administered 2012-12-07 – 2012-12-09 (×3): 150 mg via ORAL
  Filled 2012-12-06 (×3): qty 1
  Filled 2012-12-06: qty 3
  Filled 2012-12-06 (×2): qty 1

## 2012-12-06 MED ORDER — MAGNESIUM HYDROXIDE 400 MG/5ML PO SUSP: 30.0000 mL | Freq: Every day | ORAL | Status: DC | PRN

## 2012-12-06 MED ORDER — LISINOPRIL 5 MG PO TABS
5.0000 mg | ORAL_TABLET | Freq: Every day | ORAL | Status: DC
  Administered 2012-12-06 – 2012-12-09 (×4): 5 mg via ORAL
  Filled 2012-12-06 (×6): qty 1

## 2012-12-06 MED ORDER — ALUM & MAG HYDROXIDE-SIMETH 200-200-20 MG/5ML PO SUSP: 30.0000 mL | ORAL | Status: DC | PRN

## 2012-12-06 NOTE — Tx Team (Signed)
Initial Interdisciplinary Treatment Plan  PATIENT STRENGTHS: (choose at least two) Capable of independent living Motivation for treatment/growth Supportive family/friends  PATIENT STRESSORS: Medication change or noncompliance Occupational concerns   PROBLEM LIST: Problem List/Patient Goals Date to be addressed Date deferred Reason deferred Estimated date of resolution  Anxiety 12/06/2012   D/C  Depression 12/06/2012   D/C                                             DISCHARGE CRITERIA:  Improved stabilization in mood, thinking, and/or behavior Motivation to continue treatment in a less acute level of care  PRELIMINARY DISCHARGE PLAN: Outpatient therapy  PATIENT/FAMIILY INVOLVEMENT: This treatment plan has been presented to and reviewed with the patient, [name], and/or family member.  The patient and family have been given the opportunity to ask questions and make suggestions.  Joyce Gross, Ezabella Teska Joy 12/06/2012, 2:29 AM

## 2012-12-06 NOTE — [facility] Suicide Risk Assessment (Signed)
[facility] INPATIENT:  Family/Significant Other Suicide Prevention Education  Suicide Prevention Education:  Patient Refusal for Family/Significant Other Suicide Prevention Education: The patient Alyssa Prince has refused to provide written consent for family/significant other to be provided Family/Significant Other Suicide Prevention Education during admission and/or prior to discharge.  Physician notified.  Wynn Banker 12/06/2012, 3:53 PM

## 2012-12-06 NOTE — [facility] Suicide Risk Assessment (Signed)
Suicide Risk Assessment  Admission Assessment     Nursing information obtained from:  Patient Demographic factors:  NA Current Mental Status:  NA Loss Factors:  NA Historical Factors:  NA Risk Reduction Factors:  Positive social support;Sense of responsibility to family;Responsible for children under 40 years of age  CLINICAL FACTORS:   Severe Anxiety and/or Agitation Bipolar Disorder:   Mixed State More than one psychiatric diagnosis Previous Psychiatric Diagnoses and Treatments Medical Diagnoses and Treatments/Surgeries  COGNITIVE FEATURES THAT CONTRIBUTE TO RISK:  Closed-mindedness Polarized thinking    SUICIDE RISK:   Moderate:  Frequent suicidal ideation with limited intensity, and duration, some specificity in terms of plans, no associated intent, good self-control, limited dysphoria/symptomatology, some risk factors present, and identifiable protective factors, including available and accessible social support.  PLAN OF CARE: Admit voluntarily, emergently from [facility] for bipolar disorder, depression and anxiety and needs crisis stabilization.   I certify that inpatient services furnished can reasonably be expected to improve the patient's condition.   Nehemiah Settle., MD 12/06/2012, 1:13 PM

## 2012-12-06 NOTE — [facility] Group Notes (Signed)
[facility] LCSW Aftercare Discharge Planning Group Note   12/06/2012 1:14 PM    Participation Quality:  Appropraite  Mood/Affect:  Appropriate  Depression Rating: 2-3  Anxiety Rating:  8-9  Thoughts of Suicide:  No  Will you contract for safety?   NA  Current AVH:  No  Plan for Discharge/Comments:  Patient attending discharge planning group and actively participated in group.  She advised she is scheduled for a  Medication management with Dr. Gloris Manchester but has not been seen.  CSW provided all participants with daily workbook and information on services offered by [facility] [place] [facility].   Transportation Means: Patient has transportation.   Supports:  Patient has a support system.   Raquelle Pietro, [employer], [place]
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
## 2012-12-06 NOTE — ED Provider Notes (Signed)
Medical screening examination/treatment/procedure(s) were performed by non-physician practitioner and as supervising physician I was immediately available for consultation/collaboration.    Camrin Lapre Joseph Amai Cappiello, MD 12/06/12 1437 

## 2012-12-06 NOTE — Progress Notes (Signed)
Psychoeducational Group Note  Date:  12/06/2012 Time:  1100  Group Topic/Focus:  Self Care:   The focus of this group is to help patients understand the importance of self-care in order to improve or restore emotional, physical, spiritual, interpersonal, and financial health.  Participation Level: Did Not Attend  Participation Quality:  Not Applicable  Affect:  Not Applicable  Cognitive:  Not Applicable  Insight:  Not Applicable  Engagement in Group: Not Applicable  Additional Comments:  Patient did not attend group, patient remained in bed.  Karleen Hampshire Brittini 12/06/2012, 7:10 PM
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
## 2012-12-06 NOTE — Progress Notes (Signed)
Pt observed in her room sitting on her bed.  Pt reports she is doing better this evening, but pt still appears flat/depressed.  She denies SI/HI/AV.  She is unsure when she will be discharged.  Pt smiles during conversation and thoughts seem clear and logical.  She says she is not hearing any voices at this time.  Pt makes her needs known to staff.  Pt voices no needs or concerns at this time.  Support and encouragement offered.  Safety maintained with q15 minute checks.

## 2012-12-06 NOTE — Progress Notes (Signed)
Recreation Therapy Notes   Date: 08.11.2014 Time: 3:00pm Location: [address] Dayroom  Group Topic: Stress Management  Goal Area(s) Addresses:  Patient will verbalize importance of using healthy stress management.  Patient will identify stress management technique of choice.  Behavioral Response: Engaged in activity, Appropriate, Actively listener  Intervention: Visualization  Activity: Activity: Guided Imagery. Patients listened to recorded Guided Imagery script about a day at the beach. Patients received stress ball at the conclusion of group session. Patients were instructed on rules of having a stress ball and verbalized understanding on rules.  Education:  Pharmacologist, Discharge Planing  Education Outcome: Acknowledges understanding  Clinical Observations/Feedback: Patient participated in group session. Patient made no contributions to opening or wrap up discussion, however she appeared to actively listen as she maintained appropriate eye contact with speaker and followed the conversation around the room. Patient actively participated in guided imagery, participating with eyes closed.   Marykay Lex Helyne Genther, LRT/CTRS  Jearl Klinefelter 12/06/2012 4:29 PM
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
## 2012-12-06 NOTE — [facility] Group Notes (Signed)
[facility] LCSW Group Therapy  12/06/2012 3:31 PM  Type of Therapy:  Group Therapy  Participation Level:  Did Not Attend  Wynn Banker 12/06/2012, 3:31 PM

## 2012-12-07 DIAGNOSIS — F3110 Bipolar disorder, current episode manic without psychotic features, unspecified: Secondary | ICD-10-CM

## 2012-12-07 NOTE — Progress Notes (Signed)
The focus of this group is to educate the patient on the purpose and policies of crisis stabilization and provide a format to answer questions about their admission.  The group details unit policies and expectations of patients while admitted.  Patient attended 0900 nurse education orientation group.  Patient actively participated, appropriate affect, alert, appropriate insight and engagement.  Patient will work on discharge goals today.

## 2012-12-07 NOTE — [facility] Group Notes (Signed)
[facility] LCSW Group Therapy      Feelings About Diagnosis 1:15 - 2:30 PM         12/07/2012  2:49 PM    Type of Therapy:  Group Therapy  Participation Level:  Did not attend group. Wynn Banker 12/07/2012  2:49 PM

## 2012-12-07 NOTE — Progress Notes (Signed)
Recreation Therapy Notes  Date: 08.12.2014 Time: 2:45pm Location: [address] Dayroom  Group Topic: Animal Assisted Activities  Goal Area(s) Addresses:  Patient will interact appropriately with dog team.    Behavioral Response: Did not attend.  Inika Bellanger L Latrenda Irani, LRT/CTRS  Lynley Killilea L 12/07/2012 4:58 PM 

## 2012-12-07 NOTE — Progress Notes (Signed)
D:  Patient's self inventory sheet, patient sleeps well, improving appetite, low energy level, improving attention span.  Denied depression and hopelessness.   Rated anxiety #6.  Has experienced craving for cigarettes.  Denied SI.  Feels stress in chest.  No questions for staff.  Plans to return home after discharge.  Will be able to purchase/take his meds after discharge. A:  Medications administered per MD orders.   Emotional support and encouragement given patient. R:  Denied SI and HI.  Denied A/V hallucinations.  Denied pain.  Will continue 15 minute checks for safety.  Safety maintained.

## 2012-12-07 NOTE — Progress Notes (Signed)
Adult Psychoeducational Group Note  Date:  12/07/2012 Time:  1:03 PM  Group Topic/Focus:  Recovery Goals:   The focus of this group is to identify appropriate goals for recovery and establish a plan to achieve them.  Participation Level:  Active  Participation Quality:  Appropriate  Affect:  Appropriate  Cognitive:  Appropriate  Insight: Appropriate  Engagement in Group:  Developing/Improving and Improving  Modes of Intervention:  Discussion  Additional Comments:  Pt stated that to her recovery was for her to get her medication changed and to stick to her doctor visits and to use the support of her family.   Elbie Statzer 12/07/2012, 1:03 PM

## 2012-12-07 NOTE — Progress Notes (Signed)
[facility] MD Progress Note  12/07/2012 4:12 PM Alyssa Prince  MRN:  [id number] Subjective:  "I need my Lithium changed to extended release please." She reported good sleep,  And good appetite, but notes her depression is only 1-2/10 and her anxiety is higher 4-5/10. She does continue with pressured speech, and racing thoughts. She seems to be minimizing her other symptoms. Diagnosis:  Bipolar I disorder, most recent episode (or current) manic   ADL's:  Intact  Sleep: Fair  Appetite:  Fair  Suicidal Ideation:  denies Homicidal Ideation:  Yes, but refuses to identify AEB (as evidenced by):  Psychiatric Specialty Exam: Review of Systems  Constitutional: Negative.  Negative for fever, chills, weight loss, malaise/fatigue and diaphoresis.  HENT: Negative for congestion and sore throat.   Eyes: Negative for blurred vision, double vision and photophobia.  Respiratory: Negative for cough, shortness of breath and wheezing.   Cardiovascular: Negative for chest pain, palpitations and PND.  Gastrointestinal: Negative for heartburn, nausea, vomiting, abdominal pain, diarrhea and constipation.  Musculoskeletal: Negative for myalgias, joint pain and falls.  Neurological: Negative for dizziness, tingling, tremors, sensory change, speech change, focal weakness, seizures, loss of consciousness, weakness and headaches.  Endo/Heme/Allergies: Negative for polydipsia. Does not bruise/bleed easily.  Psychiatric/Behavioral: Negative for depression, suicidal ideas, hallucinations, memory loss and substance abuse. The patient is not nervous/anxious and does not have insomnia.     Blood pressure 120/77, pulse 85, temperature 98.3 F (36.8 C), temperature source Oral, resp. rate 18, height 5\' 4"  (1.626 m), weight 120.657 kg (266 lb), last menstrual period 11/26/2012.Body mass index is 45.64 kg/(m^2).  General Appearance: Fairly Groomed  Patent attorney::  Good  Speech:  Pressured  Volume:  Normal  Mood:  grandiose   Affect:  Full Range  Thought Process:  Goal Directed  Orientation:  Full (Time, Place, and Person)  Thought Content:  WDL  Suicidal Thoughts:  No  Homicidal Thoughts:  Yes.  without intent/plan  Memory:  Immediate;   Fair  Judgement:  Impaired  Insight:  Present  Psychomotor Activity:  Increased  Concentration:  Poor  Recall:  Poor  Akathisia:  No  Handed:  Right  AIMS (if indicated):     Assets:  Communication Skills Desire for Improvement  Sleep:  Number of Hours: 6.25   Current Medications: Current Facility-Administered Medications  Medication Dose Route Frequency Provider Last Rate Last Dose  . acetaminophen (TYLENOL) tablet 650 mg  650 mg Oral Q6H PRN Court Joy, PA-C      . albuterol (PROVENTIL HFA;VENTOLIN HFA) 108 (90 BASE) MCG/ACT inhaler 2 puff  2 puff Inhalation Q6H PRN Court Joy, PA-C   2 puff at 12/07/12 0533  . ALPRAZolam Prudy Feeler) tablet 0.5 mg  0.5 mg Oral BID PRN Court Joy, PA-C   0.5 mg at 12/06/12 2205  . alum & mag hydroxide-simeth (MAALOX/MYLANTA) 200-200-20 MG/5ML suspension 30 mL  30 mL Oral Q4H PRN Court Joy, PA-C      . aspirin EC tablet 325 mg  325 mg Oral Daily Court Joy, PA-C   325 mg at 12/07/12 0804  . buPROPion [facility] SR) 12 hr tablet 150 mg  150 mg Oral Daily Nehemiah Settle, MD   150 mg at 12/07/12 0804  . hydrochlorothiazide (MICROZIDE) capsule 12.5 mg  12.5 mg Oral Daily Court Joy, PA-C   12.5 mg at 12/07/12 0805  . lisinopril (PRINIVIL,ZESTRIL) tablet 5 mg  5 mg Oral Daily Court Joy, PA-C  5 mg at 12/07/12 0806  . lithium carbonate (LITHOBID) CR tablet 600 mg  600 mg Oral Q12H Nehemiah Settle, MD   600 mg at 12/07/12 0805  . magnesium hydroxide (MILK OF MAGNESIA) suspension 30 mL  30 mL Oral Daily PRN Court Joy, PA-C      . nicotine (NICODERM CQ - dosed in mg/24 hours) patch 21 mg  21 mg Transdermal Q0600 Court Joy, PA-C   21 mg at 12/07/12 0536    Lab Results:   Results for orders placed during the hospital encounter of 12/05/12 (from the past 48 hour(s))  LITHIUM LEVEL     Status: Abnormal   Collection Time    12/05/12  9:15 PM      Result Value Range   Lithium Lvl 0.76 (*) 0.80 - 1.40 mEq/L  POCT I-STAT, CHEM 8     Status: None   Collection Time    12/05/12  9:23 PM      Result Value Range   Sodium 139  135 - 145 mEq/L   Potassium 3.7  3.5 - 5.1 mEq/L   Chloride 104  96 - 112 mEq/L   BUN 10  6 - 23 mg/dL   Creatinine, Ser 3.08  0.50 - 1.10 mg/dL   Glucose, Bld 97  70 - 99 mg/dL   Calcium, Ion 6.57  8.46 - 1.23 mmol/L   TCO2 24  0 - 100 mmol/L   Hemoglobin 13.6  12.0 - 15.0 g/dL   HCT 96.2  95.2 - 84.1 %    Physical Findings: AIMS: Facial and Oral Movements Muscles of Facial Expression: None, normal Lips and Perioral Area: None, normal Jaw: None, normal Tongue: None, normal,Extremity Movements Upper (arms, wrists, hands, fingers): None, normal Lower (legs, knees, ankles, toes): None, normal, Trunk Movements Neck, shoulders, hips: None, normal, Overall Severity Severity of abnormal movements (highest score from questions above): None, normal Incapacitation due to abnormal movements: None, normal Patient's awareness of abnormal movements (rate only patient's report): No Awareness, Dental Status Current problems with teeth and/or dentures?: No Does patient usually wear dentures?: No  CIWA:  CIWA-Ar Total: 3 COWS:  COWS Total Score: 2  Treatment Plan Summary: Daily contact with patient to assess and evaluate symptoms and progress in treatment Medication management  Plan: 1. Continue crisis management and stabilization. 2. Medication management to reduce current symptoms to base line and improve patient's overall level of functioning 3. Treat health problems as indicated. 4. Develop treatment plan to decrease risk of relapse upon discharge and the need for readmission. 5. Psycho-social education regarding relapse prevention and  self care. 6. Health care follow up as needed for medical problems. 7. Continue home medications where appropriate. 8. ELOS: 1-3 days.  Medical Decision Making Problem Points:  Established problem, stable/improving (1) Data Points:  Review of medication regiment & side effects (2)  I certify that inpatient services furnished can reasonably be expected to improve the patient's condition.  Rona Ravens. Mashburn RPAC 4:28 PM 12/07/2012  Patient was evaluated, developed treatment and case discussed with treatment team. Reviewed the information documented and agree with the treatment plan.   Nehemiah Settle., M.D. 12/10/2012 8:38 PM

## 2012-12-07 NOTE — Progress Notes (Signed)
The focus of this group is to educate the patient on the purpose and policies of crisis stabilization and provide a format to answer questions about their admission.  The group details unit policies and expectations of patients while admitted. Patient did not attend. 

## 2012-12-07 NOTE — Progress Notes (Signed)
Pt reports she is doing better and may discharge home tomorrow.  Pt says she is feeling a little anxious though, and requests Xanax.  Pt has been able to get through the day without a dose.  Pt praised for being able to go through the day without any PRNs.  Pt is bright in conversation, with clear, logical thoughts.  Pt denies SI/HI/AV.  Pt makes her needs known to staff.  Safety maintained with q15 minute checks.

## 2012-12-08 MED ORDER — NICOTINE POLACRILEX 2 MG MT GUM
2.0000 mg | CHEWING_GUM | OROMUCOSAL | Status: DC | PRN
  Administered 2012-12-08 – 2012-12-09 (×4): 2 mg via ORAL

## 2012-12-08 NOTE — Progress Notes (Signed)
Adult Psychoeducational Group Note  Date:  12/08/2012 Time:  9:20 PM  Group Topic/Focus:  Wrap-Up Group:   The focus of this group is to help patients review their daily goal of treatment and discuss progress on daily workbooks.  Participation Level:  Active  Participation Quality:  Appropriate  Affect:  Appropriate  Cognitive:  Appropriate  Insight: Improving  Engagement in Group:  Improving  Modes of Intervention:  Exploration  Additional Comments:  Pt stated that she learned how to handle out of control situations in a positive way. Pt stated that one positive is that she was able to go outside.  Ranette Luckadoo, Randal Buba 12/08/2012, 9:20 PM

## 2012-12-08 NOTE — Progress Notes (Signed)
Patient ID: Alyssa Prince, female   DOB: 1970/08/19, 42 y.o.   MRN: [id number] [facility] MD Progress Note  12/08/2012 4:05 PM Alyssa Prince  MRN:  [id number] Subjective/Objective: Patient is up and active on the unit, states she is attending some groups, feels just tired today due to her new roommate's snoring. Speech is goal directed and slowing to a normal rate, mood is less grandiose, wants to have her buspar added back as well.   Diagnosis:  Bipolar I disorder, most recent episode (or current) manic   ADL's:  Intact  Sleep: Fair  Appetite:  Fair  Suicidal Ideation:  denies Homicidal Ideation:  Yes, but refuses to identify AEB (as evidenced by):  Psychiatric Specialty Exam: Review of Systems  Constitutional: Negative.  Negative for fever, chills, weight loss, malaise/fatigue and diaphoresis.  HENT: Negative for congestion and sore throat.   Eyes: Negative for blurred vision, double vision and photophobia.  Respiratory: Negative for cough, shortness of breath and wheezing.   Cardiovascular: Negative for chest pain, palpitations and PND.  Gastrointestinal: Negative for heartburn, nausea, vomiting, abdominal pain, diarrhea and constipation.  Musculoskeletal: Negative for myalgias, joint pain and falls.  Neurological: Negative for dizziness, tingling, tremors, sensory change, speech change, focal weakness, seizures, loss of consciousness, weakness and headaches.  Endo/Heme/Allergies: Negative for polydipsia. Does not bruise/bleed easily.  Psychiatric/Behavioral: Negative for depression, suicidal ideas, hallucinations, memory loss and substance abuse. The patient is not nervous/anxious and does not have insomnia.     Blood pressure 110/80, pulse 93, temperature 98.4 F (36.9 C), temperature source Oral, resp. rate 24, height 5\' 4"  (1.626 m), weight 120.657 kg (266 lb), last menstrual period 11/26/2012.Body mass index is 45.64 kg/(m^2).  General Appearance: Fairly Groomed  Patent attorney::   Good  Speech:  Pressured  Volume:  Normal  Mood:  grandiose  Affect:  Full Range  Thought Process:  Goal Directed  Orientation:  Full (Time, Place, and Person)  Thought Content:  WDL  Suicidal Thoughts:  No  Homicidal Thoughts:  Yes.  without intent/plan  Memory:  Immediate;   Fair  Judgement:  Impaired  Insight:  Present  Psychomotor Activity:  Increased  Concentration:  Poor  Recall:  Poor  Akathisia:  No  Handed:  Right  AIMS (if indicated):     Assets:  Communication Skills Desire for Improvement  Sleep:  Number of Hours: 4.75   Current Medications: Current Facility-Administered Medications  Medication Dose Route Frequency Provider Last Rate Last Dose  . acetaminophen (TYLENOL) tablet 650 mg  650 mg Oral Q6H PRN Court Joy, PA-C      . albuterol (PROVENTIL HFA;VENTOLIN HFA) 108 (90 BASE) MCG/ACT inhaler 2 puff  2 puff Inhalation Q6H PRN Court Joy, PA-C   2 puff at 12/07/12 1948  . ALPRAZolam Prudy Feeler) tablet 0.5 mg  0.5 mg Oral BID PRN Court Joy, PA-C   0.5 mg at 12/08/12 0818  . alum & mag hydroxide-simeth (MAALOX/MYLANTA) 200-200-20 MG/5ML suspension 30 mL  30 mL Oral Q4H PRN Court Joy, PA-C      . aspirin EC tablet 325 mg  325 mg Oral Daily Court Joy, PA-C   325 mg at 12/08/12 0817  . buPROPion [facility] SR) 12 hr tablet 150 mg  150 mg Oral Daily Nehemiah Settle, MD   150 mg at 12/08/12 0817  . hydrochlorothiazide (MICROZIDE) capsule 12.5 mg  12.5 mg Oral Daily Court Joy, PA-C   12.5 mg at 12/08/12 0817  .  lisinopril (PRINIVIL,ZESTRIL) tablet 5 mg  5 mg Oral Daily Court Joy, PA-C   5 mg at 12/08/12 0817  . lithium carbonate (LITHOBID) CR tablet 600 mg  600 mg Oral Q12H Nehemiah Settle, MD   600 mg at 12/08/12 0816  . magnesium hydroxide (MILK OF MAGNESIA) suspension 30 mL  30 mL Oral Daily PRN Court Joy, PA-C      . nicotine polacrilex (NICORETTE) gum 2 mg  2 mg Oral PRN Nehemiah Settle, MD   2 mg  at 12/08/12 1600    Lab Results:  No results found for this or any previous visit (from the past 48 hour(s)).  Physical Findings: AIMS: Facial and Oral Movements Muscles of Facial Expression: None, normal Lips and Perioral Area: None, normal Jaw: None, normal Tongue: None, normal,Extremity Movements Upper (arms, wrists, hands, fingers): None, normal Lower (legs, knees, ankles, toes): None, normal, Trunk Movements Neck, shoulders, hips: None, normal, Overall Severity Severity of abnormal movements (highest score from questions above): None, normal Incapacitation due to abnormal movements: None, normal Patient's awareness of abnormal movements (rate only patient's report): No Awareness, Dental Status Current problems with teeth and/or dentures?: No Does patient usually wear dentures?: No  CIWA:  CIWA-Ar Total: 3 COWS:  COWS Total Score: 2  Treatment Plan Summary: Daily contact with patient to assess and evaluate symptoms and progress in treatment Medication management  Plan: 1. Continue crisis management and stabilization. 2. Medication management to reduce current symptoms to base line and improve patient's overall level of functioning 3. Treat health problems as indicated. 4. Develop treatment plan to decrease risk of relapse upon discharge and the need for readmission. 5. Psycho-social education regarding relapse prevention and self care. 6. Health care follow up as needed for medical problems. 7. Continue home medications where appropriate. 8. ELOS: 1-3 days.  Medical Decision Making Problem Points:  Established problem, stable/improving (1) Data Points:  Review of medication regiment & side effects (2)  I certify that inpatient services furnished can reasonably be expected to improve the patient's condition.  Rona Ravens. Mashburn RPAC 4:05 PM 12/08/2012  Reviewed the information documented and agree with the treatment plan.  Nehemiah Settle.,  M.D. 12/10/2012 8:40 PM

## 2012-12-08 NOTE — Progress Notes (Signed)
Adult Psychoeducational Group Note  Date:  12/08/2012 Time:  7:22 PM  Group Topic/Focus:  Crisis Planning:   The purpose of this group is to help patients create a crisis plan for use upon discharge or in the future, as needed.  Participation Level:  None  Participation Quality:  Inattentive  Affect:  Appropriate, Flat and Lethargic  Cognitive:  Alert and Appropriate  Insight: Appropriate  Engagement in Group:  None  Modes of Intervention:  Discussion, Education and Support  Additional Comments:  Pt was lethargic throughout group, was inattentive.   Dalia Heading 12/08/2012, 7:22 PM

## 2012-12-08 NOTE — Progress Notes (Signed)
Recreation Therapy Notes  Date: 08.13.2014 Time: 3:00pm Location: [address] Dayroom  Group Topic: Communication, Team Building, Problem Solving  Goal Area(s) Addresses:  Patient will be able to recognize use of communication, team building and problem solving during course of group activity. Patient will verbalize need for communication, team building and problem solving to make group activity successful.  Patient will verbalize benefit of communication, team building and problem solving to post d/c goals.   Behavioral Response: Drowsy, Engaged, Appropriate, Sharing  Intervention: Team Building Exercise  Activity: [employer]. Patients were given the following supplied: 5 rubber bands, 5 paper clips, 3 index cards, 5 drinking straws and a length of masking tape. In teams of 3, using the provided materials patients were asked to build a structure that could launch a ping pong ball approximately 10 feet.   Education: Customer service manager, Discharge Planning, Relapse Prevention  Education Outcome: Needs additional education.   Clinical Observations/Feedback: Patient appeared disengaged during beginning of session, making no contributions to opening discussion and appearing to sleep sitting up. At approximately 3:15 patient began interacting with team members to build launching mechanism. Patient ultimately took control of teams launching mechanism and built one that group used to launch ping pong ball. Patient team unsuccessful at launching ping pong ball. Patient did not verbally identify skills needed, but nodded in agreement when peers or LRT identified necessary skills. Patient shared that prior to her admission she was using effective communication skills and she was able to properly relay that she needed help to her doctor. Patient shared that she thought her group communicated with each other, however they could have used better communication during the activity.   Marykay Lex Thatiana Renbarger,  LRT/CTRS  Trini Christiansen L 12/08/2012 5:01 PM

## 2012-12-08 NOTE — Progress Notes (Signed)
D:  Tyonna reports that she slept ok and her appetite is fair.  She reports depression and hopelessness at 2/10.  She denies SI/HI/AVH at this time.  She is attending groups and is interacting appropriately with staff and others. A:  Medications administered as ordered.  Emotional support provided.  Safety checks q 15 minutes. R:  Safety maintained on unit.

## 2012-12-08 NOTE — Progress Notes (Signed)
Adult Psychoeducational Group Note  Date:  12/08/2012 Time:  1000AM Group Topic/Focus:  Therapeutic Activity  Participation Level:  Active  Participation Quality:  Appropriate and Attentive  Affect:  Appropriate  Cognitive:  Alert and Appropriate  Insight: Appropriate  Engagement in Group:  Engaged  Modes of Intervention:  Discussion  Additional Comments:  Pt. Was attentive and appropriate during today's group discussion. Pt was able to do build a bear for today's therapeutic activity. Pt. Was able to guess words that involved personal development.   Bing Plume D 12/08/2012, 6:08 PM

## 2012-12-08 NOTE — Progress Notes (Signed)
Pt up to the window for her 8pm med.  Pt reports a good day.  She denies SI/HI/AV.  Pt states she may discharge on Thursday and plans to return home.  Pt feels her meds are working.  Pt says she has been going to groups.  Pt makes her needs known to staff.  Support and encouragement offered.  Safety maintained with q15 minute checks.

## 2012-12-08 NOTE — Tx Team (Signed)
Interdisciplinary Treatment Plan Update   Date Reviewed:  12/08/2012  Time Reviewed:  10:29 AM  Progress in Treatment:   Attending groups: Yes Participating in groups: Yes Taking medication as prescribed: Yes  Tolerating medication: Yes Family/Significant other contact made: No,Patient declined family contact. Patient understands diagnosis: Yes  Discussing patient identified problems/goals with staff: Yes Medical problems stabilized or resolved: Yes Denies suicidal/homicidal ideation: Yes Patient has not harmed self or others: Yes  For review of initial/current patient goals, please see plan of care.  Estimated Length of Stay:  1 - days  Reasons for Continued Hospitalization:   New Problems/Goals identified:    Discharge Plan or Barriers:   Home with outpatient follow up with Dr. Jannifer Franklin  Additional Comments:  N/A   Attendees:  Patient:  12/08/2012 10:29 AM   Signature: Mervyn Gay, MD 12/08/2012 10:29 AM  Signature:  Verne Spurr, PA 12/08/2012 10:29 AM  Signature:  12/08/2012 10:29 AM  Signature: Harold Barban, RN 12/08/2012 10:29 AM  Signature:  Chinita Greenland RN 12/08/2012 10:29 AM  Signature:  Juline Patch, LCSW 12/08/2012 10:29 AM  Signature:  [name], LCSW 12/08/2012 10:29 AM  Signature:  Maseta Dorley,Care Coordinator 12/08/2012 10:29 AM  Signature: 12/08/2012 10:29 AM  Signature:    Signature:    Signature:      Scribe for Treatment Team:   Juline Patch,  12/08/2012 10:29 AM

## 2012-12-09 MED ORDER — LITHIUM CARBONATE ER 300 MG PO TBCR: 600.0000 mg | EXTENDED_RELEASE_TABLET | Freq: Two times a day (BID) | ORAL | Status: DC

## 2012-12-09 MED ORDER — BUPROPION HCL ER (SR) 150 MG PO TB12: 150.0000 mg | ORAL_TABLET | Freq: Every day | ORAL | Status: DC

## 2012-12-09 NOTE — Progress Notes (Signed)
Patient did not attend 0900 nurse orientation group this morning.

## 2012-12-09 NOTE — Discharge Summary (Signed)
Physician Discharge Summary Note  Patient:  Alyssa Prince is an 42 y.o., female MRN:  [id number] DOB:  August 12, 1970 Patient phone:  [phone] (home)  Patient address:   [address] [place] [address],   Date of Admission:  12/06/2012 Date of Discharge: 12/09/2012  Reason for Admission:  Bipolar mania  Discharge Diagnoses: Principal Problem:   Bipolar I disorder, most recent episode (or current) manic  Review of Systems  Constitutional: Negative.  Negative for fever, chills, weight loss, malaise/fatigue and diaphoresis.  HENT: Negative for congestion and sore throat.   Eyes: Negative for blurred vision, double vision and photophobia.  Respiratory: Negative for cough, shortness of breath and wheezing.   Cardiovascular: Negative for chest pain, palpitations and PND.  Gastrointestinal: Negative for heartburn, nausea, vomiting, abdominal pain, diarrhea and constipation.  Musculoskeletal: Negative for myalgias, joint pain and falls.  Neurological: Negative for dizziness, tingling, tremors, sensory change, speech change, focal weakness, seizures, loss of consciousness, weakness and headaches.  Endo/Heme/Allergies: Negative for polydipsia. Does not bruise/bleed easily.  Psychiatric/Behavioral: Negative for depression, suicidal ideas, hallucinations, memory loss and substance abuse. The patient is not nervous/anxious and does not have insomnia.   Discharge Diagnoses:  AXIS I: Bipolar, mixed  AXIS II: Deferred  AXIS III:  Past Medical History   Diagnosis  Date   .  Hypertension    .  Active smoker    .  Hx of hernia repair    .  Bipolar 1 disorder     AXIS IV: economic problems, other psychosocial or environmental problems, problems related to social environment and problems with primary support group  AXIS V: 51-60 moderate symptoms   Level of Care:  OP  Hospital Course:  Alyssa Prince was admitted after presenting to the [facility] for the 3rd time in 6 days. She presented stating  that her new medication dosages were not working and that she was unstable. She noted that she wanted to hurt someone but was not forthcoming. Alyssa Prince was felt to be in need of acute psychiatric hospitalization for stabilization and medication management.      Alyssa Prince was admitted to the unit and evaluated. The symptoms were identified as anhedonia, decreased sleep, poor appetite, poor concentration, poor memory, racing thoughts, and homicidal ideation toward someone but again would not disclose whom.      The patient was oriented to the unit and encouraged to participate in unit programming. Medical problems were identified and treated appropriately. Home medication was restarted as needed. Psychiatric medication management was initiated.       The patient was evaluated each day by a clinical provider to ascertain the patient's response to treatment.  Improvement was noted by the patient's report of decreasing symptoms, improved sleep and appetite, affect, medication tolerance, behavior, and participation in unit programming.  Alyssa Prince was asked each day to complete a self inventory noting mood, mental status, pain, new symptoms, anxiety and concerns.        The patient responded well to medication and being in a therapeutic and supportive environment. Positive and appropriate behavior was noted and the patient was motivated for recovery. She worked closely with the treatment team and case manager to develop a discharge plan with appropriate goals. Coping skills, problem solving as well as relaxation therapies were also part of the unit programming.         By the day of discharge the patient was in much improved condition than upon admission.  Symptoms were reported as significantly  decreased or resolved completely.  The patient denied SI/HI and voiced no AVH. He/she was motivated to continue taking medication with a goal of continued improvement in mental health.  She was discharged home with a plan to  follow up as noted below.  Consults:  None  Significant Diagnostic Studies:  None  Discharge Vitals:   Blood pressure 143/86, pulse 86, temperature 98 F (36.7 C), temperature source Oral, resp. rate 20, height 5\' 4"  (1.626 m), weight 120.657 kg (266 lb), last menstrual period 11/26/2012. Body mass index is 45.64 kg/(m^2). Lab Results:   No results found for this or any previous visit (from the past 72 hour(s)).  Physical Findings: AIMS: Facial and Oral Movements Muscles of Facial Expression: None, normal Lips and Perioral Area: None, normal Jaw: None, normal Tongue: None, normal,Extremity Movements Upper (arms, wrists, hands, fingers): None, normal Lower (legs, knees, ankles, toes): None, normal, Trunk Movements Neck, shoulders, hips: None, normal, Overall Severity Severity of abnormal movements (highest score from questions above): None, normal Incapacitation due to abnormal movements: None, normal Patient's awareness of abnormal movements (rate only patient's report): No Awareness, Dental Status Current problems with teeth and/or dentures?: No Does patient usually wear dentures?: No  CIWA:  CIWA-Ar Total: 3 COWS:  COWS Total Score: 2  Psychiatric Specialty Exam: See Psychiatric Specialty Exam and Suicide Risk Assessment completed by Attending Physician prior to discharge.  Discharge destination:  Home  Is patient on multiple antipsychotic therapies at discharge:  No   Has Patient had three or more failed trials of antipsychotic monotherapy by history:  No  Recommended Plan for Multiple Antipsychotic Therapies: NA  Discharge Orders   Future Orders Complete By Expires   Diet - low sodium heart healthy  As directed    Discharge instructions  As directed    Comments:     Take all of your medications as directed. Be sure to keep all of your follow up appointments.  If you are unable to keep your follow up appointment, call your Doctor's office to let them know, and  reschedule.  Make sure that you have enough medication to last until your appointment. Be sure to get plenty of rest. Going to bed at the same time each night will help. Try to avoid sleeping during the day.  Increase your activity as tolerated. Regular exercise will help you to sleep better and improve your mental health. Eating a heart healthy diet is recommended. Try to avoid salty or fried foods. Be sure to avoid all alcohol and illegal drugs.   Increase activity slowly  As directed        Medication List    STOP taking these medications       ALPRAZolam 0.5 MG tablet  Commonly known as:  XANAX     B-12 SL     hydrochlorothiazide 12.5 MG capsule  Commonly known as:  MICROZIDE      TAKE these medications     Indication   albuterol 108 (90 BASE) MCG/ACT inhaler  Commonly known as:  PROVENTIL HFA;VENTOLIN HFA  Inhale 2 puffs into the lungs every 6 (six) hours as needed for wheezing or shortness of breath.    wheezing and shortness of breath   aspirin 325 MG tablet  Take 325 mg by mouth daily.      buPROPion 150 MG 12 hr tablet  Commonly known as:  WELLBUTRIN SR  Take 1 tablet (150 mg total) by mouth daily.   Indication:  Depressive Phase of Manic-Depression  lisinopril 5 MG tablet  Commonly known as:  PRINIVIL,ZESTRIL  Take 1 tablet (5 mg total) by mouth every other day.    Hypertension   lithium carbonate 300 MG CR tablet  Commonly known as:  LITHOBID  Take 2 tablets (600 mg total) by mouth every 12 (twelve) hours.   Indication:  Manic Phase of Manic-Depression     naproxen sodium 220 MG tablet  Commonly known as:  ANAPROX  Take 440 mg by mouth 2 (two) times daily as needed (for pain).    moderate pain         Follow-up Information   Follow up with Dr. Jannifer Franklin - [facility] On 12/10/2012. (Friday, December 10, 2012 at 12:20 PM)    Contact information:   [address] [place], [place]  [phone]      Follow-up recommendations:    Activities: Resume activity as tolerated. Diet: Heart healthy low sodium diet Tests: Follow up testing will be determined by your out patient provider.  Comments:    Total Discharge Time:  Greater than 30 minutes.  Signed: MASHBURN,NEIL 12/09/2012, 10:43 AM  Reviewed the information documented and agree with the treatment plan.  Alyssa Prince,Alyssa R. 12/12/2012 1:36 PM

## 2012-12-09 NOTE — Progress Notes (Signed)
D:  Patient denied SI & HI.  Denied A/V hallucinations.  Denied pain.  Rated depression #3, anxiety #5, hopeless #1.   A:  Medications administered per MD orders.  Emotional support and encouragement given patient. R:  Denied SI and HI.   Denied A/V hallucinations.  15 checks for safety in progress.  Safety maintained.

## 2012-12-09 NOTE — Progress Notes (Signed)
[facility] Discharge Plan :  Will you be returning to the same living situation after discharge: Yes,  Patient is returning to her home. At discharge, do you have transportation home?:Yes,  Patient has transportation home. Do you have the ability to pay for your medications:Yes,  Patient able to make co-pay  Release of information consent forms completed and in the chart;  Patient's signature needed at discharge.  Patient to Follow up at: Follow-up Information   Follow up with Dr. Jannifer Franklin - [facility] On 12/10/2012. (Friday, December 10, 2012 at 12:20 PM)    Contact information:   [address] [place], [place]  [phone]      Patient denies SI/HI:   Patient no longer endorsing SI/HI or other thoughts of self harm.     Safety Planning and Suicide Prevention discussed: .Reviewed with all patients during discharge planning group    Malay Fantroy, [name] 12/09/2012, 10:50 AM

## 2012-12-09 NOTE — Progress Notes (Signed)
Discharge Note:  Patient discharged home.  Denied SI and HI.  Denied A/V hallucinations.   Denied pain.  Suicide prevention information given to patient and discussed with patient who stated she understood and had no questions.  Patient received all her belongings, medications, prescriptions, bags, miscellaneous items, toiletries, phone, keys, checkbook, wallet.  Patient stated she appreciated all assistance received from [facility] staff.  Patient has been cooperative and pleasant.

## 2012-12-09 NOTE — [facility] Suicide Risk Assessment (Signed)
Suicide Risk Assessment  Discharge Assessment     Demographic Factors:  Adolescent or young adult and Low socioeconomic status  Mental Status Per Nursing Assessment::   On Admission:  NA  Current Mental Status by Physician: Mental Status Examination: Patient appeared as per his stated age, casually dressed, and fairly groomed, and maintaining good eye contact. Patient has good mood and her affect was constricted. She has normal rate, rhythm, and volume of speech. Her thought process is linear and goal directed. Patient has denied suicidal, homicidal ideation, intension or plans. Patient has no evidence of auditory or visual hallucinations, delusions, and paranoia. Patient has fair insight judgment and impulse control.  Loss Factors: Decrease in vocational status and Financial problems/change in socioeconomic status  Historical Factors: Family history of mental illness or substance abuse and Impulsivity  Risk Reduction Factors:   Sense of responsibility to family, Religious beliefs about death, Living with another person, especially a relative, Positive social support, Positive therapeutic relationship and Positive coping skills or problem solving skills  Continued Clinical Symptoms:  Bipolar Disorder:   Mixed State Previous Psychiatric Diagnoses and Treatments Medical Diagnoses and Treatments/Surgeries  Cognitive Features That Contribute To Risk:  Polarized thinking    Suicide Risk:  Minimal: No identifiable suicidal ideation.  Patients presenting with no risk factors but with morbid ruminations; may be classified as minimal risk based on the severity of the depressive symptoms  Discharge Diagnoses:   AXIS I:  Bipolar, mixed AXIS II:  Deferred AXIS III:   Past Medical History  Diagnosis Date  . Hypertension   . Active smoker   . Hx of hernia repair   . Bipolar 1 disorder    AXIS IV:  economic problems, other psychosocial or environmental problems, problems related to  social environment and problems with primary support group AXIS V:  51-60 moderate symptoms  Plan Of Care/Follow-up recommendations:  Activity:  As she is Diet:  Regular  Is patient on multiple antipsychotic therapies at discharge:  No   Has Patient had three or more failed trials of antipsychotic monotherapy by history:  No  Recommended Plan for Multiple Antipsychotic Therapies: Not applicable  Nehemiah Settle., M.D. 12/09/2012, 10:57 AM

## 2012-12-14 NOTE — Progress Notes (Signed)
Patient Discharge Instructions:  After Visit Summary (AVS):   Faxed to:  12/14/12 Discharge Summary Note:   Faxed to:  12/14/12 Psychiatric Admission Assessment Note:   Faxed to:  12/14/12 Suicide Risk Assessment - Discharge Assessment:   Faxed to:  12/14/12 Faxed/Sent to the Next [facility] Care provider:  12/14/12 Faxed to [facility] @ [phone]  Jerelene Redden, 12/14/2012, 3:24 PM

## 2012-12-20 ENCOUNTER — Emergency Department (HOSPITAL_COMMUNITY)
Admission: EM | Admit: 2012-12-20 | Discharge: 2012-12-20 | Disposition: A | Discharge status: Home / Self Care | Payer: Medicaid Other | Attending: Emergency Medicine | Admitting: Emergency Medicine

## 2012-12-20 ENCOUNTER — Encounter (HOSPITAL_COMMUNITY): Payer: Self-pay | Admitting: Emergency Medicine

## 2012-12-20 DIAGNOSIS — F17200 Nicotine dependence, unspecified, uncomplicated: Secondary | ICD-10-CM | POA: Insufficient documentation

## 2012-12-20 DIAGNOSIS — Z79899 Other long term (current) drug therapy: Secondary | ICD-10-CM | POA: Insufficient documentation

## 2012-12-20 DIAGNOSIS — I10 Essential (primary) hypertension: Secondary | ICD-10-CM | POA: Insufficient documentation

## 2012-12-20 DIAGNOSIS — Z8719 Personal history of other diseases of the digestive system: Secondary | ICD-10-CM | POA: Insufficient documentation

## 2012-12-20 DIAGNOSIS — R51 Headache: Secondary | ICD-10-CM | POA: Insufficient documentation

## 2012-12-20 DIAGNOSIS — F319 Bipolar disorder, unspecified: Secondary | ICD-10-CM | POA: Insufficient documentation

## 2012-12-20 HISTORY — DX: Major depressive disorder, single episode, unspecified: F32.9

## 2012-12-20 HISTORY — DX: Depression, unspecified: F32.A

## 2012-12-20 MED ORDER — METOCLOPRAMIDE HCL 5 MG/ML IJ SOLN
10.0000 mg | Freq: Once | INTRAMUSCULAR | Status: AC
  Administered 2012-12-20: 10 mg via INTRAVENOUS
  Filled 2012-12-20: qty 2

## 2012-12-20 MED ORDER — DIPHENHYDRAMINE HCL 50 MG/ML IJ SOLN
25.0000 mg | Freq: Once | INTRAMUSCULAR | Status: AC
  Administered 2012-12-20: 25 mg via INTRAVENOUS
  Filled 2012-12-20: qty 1

## 2012-12-20 MED ORDER — DEXAMETHASONE SODIUM PHOSPHATE 4 MG/ML IJ SOLN
4.0000 mg | Freq: Once | INTRAMUSCULAR | Status: AC
  Administered 2012-12-20: 4 mg via INTRAVENOUS
  Filled 2012-12-20: qty 1

## 2012-12-20 MED ORDER — SODIUM CHLORIDE 0.9 % IV BOLUS (SEPSIS)
1000.0000 mL | Freq: Once | INTRAVENOUS | Status: AC
  Administered 2012-12-20: 1000 mL via INTRAVENOUS

## 2012-12-20 NOTE — ED Notes (Signed)
Reports headache and dizziness since Friday.  Denies nausea and vomiting.  Reports it feels like pressure and "a cloud of smoke" in her head.  Pt states, "It's not like a headache it is more of a brain ache."

## 2012-12-20 NOTE — ED Provider Notes (Signed)
CSN: [id number]     Arrival date & time 12/20/12  0018 History     First MD Initiated Contact with Patient 12/20/12 0110     Chief Complaint  Patient presents with  . Headache   (Consider location/radiation/quality/duration/timing/severity/associated sxs/prior Treatment) HPI Comments: Patient is a 42 year old female past medical history significant for hypertension, bipolar 1 disorder, depression presented to the emergency department for a worsening headache since Friday. Patient states her headache is located in the temporal region. Patient states "it's not like a headache it is more of a brain ache" with "a cloud of smoke" in her head. Patient rates her pain 8/10 and believes this headache may have been brought on by recent adjustments to her Bipolar medications.     Past Medical History  Diagnosis Date  . Hypertension   . Active smoker   . Hx of hernia repair   . Bipolar 1 disorder   . Depression    Past Surgical History  Procedure Laterality Date  . Cholecystectomy    . Hernia repair      4   . Ankle surgery      2 right ankle  . Tonsillectomy    . Tubal ligation     No family history on file. History  Substance Use Topics  . Smoking status: Current Every Day Smoker -- 1.00 packs/day    Types: Cigarettes  . Smokeless tobacco: Not on file  . Alcohol Use: Yes     Comment: occasionally   OB History   Grav Para Term Preterm Abortions TAB SAB Ect Mult Living                 Review of Systems  Constitutional: Negative for fever and chills.  HENT: Negative for neck pain and neck stiffness.   Gastrointestinal: Negative for nausea and vomiting.  Neurological: Positive for headaches. Negative for syncope, weakness and numbness.  All other systems reviewed and are negative.    Allergies  Wellbutrin  Home Medications   Current Outpatient Rx  Name  Route  Sig  Dispense  Refill  . albuterol (PROVENTIL HFA;VENTOLIN HFA) 108 (90 BASE) MCG/ACT inhaler   Inhalation  Inhale 2 puffs into the lungs every 6 (six) hours as needed for wheezing or shortness of breath.          . ALPRAZolam (XANAX) 1 MG tablet   Oral   Take 1 mg by mouth 2 (two) times daily.         . hydrochlorothiazide (MICROZIDE) 12.5 MG capsule   Oral   Take 12.5 mg by mouth daily.         Marland Kitchen lisinopril (PRINIVIL,ZESTRIL) 5 MG tablet   Oral   Take 1 tablet (5 mg total) by mouth every other day.   30 tablet   3   . lithium carbonate (LITHOBID) 300 MG CR tablet   Oral   Take 2 tablets (600 mg total) by mouth every 12 (twelve) hours.   120 tablet   0    BP 109/65  Pulse 84  Temp(Src) 98.6 F (37 C) (Oral)  Resp 17  SpO2 98%  LMP 11/26/2012 Physical Exam  Constitutional: She is oriented to person, place, and time. She appears well-developed and well-nourished. No distress.  Patient watching TV with the lights on upon my entrance into examination room.   HENT:  Head: Normocephalic and atraumatic.  Right Ear: External ear normal.  Left Ear: External ear normal.  Nose: Nose normal.  Mouth/Throat: Oropharynx  is clear and moist. No oropharyngeal exudate.  Eyes: Conjunctivae and EOM are normal. Pupils are equal, round, and reactive to light.  Neck: Normal range of motion. Neck supple.  Cardiovascular: Normal rate, regular rhythm, normal heart sounds and intact distal pulses.   Pulmonary/Chest: Effort normal and breath sounds normal. No respiratory distress.  Abdominal: Soft. Bowel sounds are normal. There is no tenderness.  Musculoskeletal: Normal range of motion.  Lymphadenopathy:    She has no cervical adenopathy.  Neurological: She is alert and oriented to person, place, and time. She has normal strength. No cranial nerve deficit or sensory deficit. Gait normal. GCS eye subscore is 4. GCS verbal subscore is 5. GCS motor subscore is 6.  No pronator drift. Bilateral heel-knee-shin intact.   Skin: Skin is warm and dry. She is not diaphoretic.  Psychiatric: She has a  normal mood and affect.    ED Course   Procedures (including critical care time)  Labs Reviewed - No data to display No results found. 1. Headache     MDM  Afebrile, NAD, non-toxic appearing, AAOx4. Pt HA treated and improved while in ED.  Presentation is like pts typical HA and non concerning for [facility], ICH, Meningitis, or temporal arteritis. Pt is afebrile with no focal neuro deficits, nuchal rigidity, or change in vision. Pt is to follow up with PCP to discuss prophylactic medication. Pt verbalizes understanding and is agreeable with plan to dc. Patient d/w with Dr. Ethelda Chick, agrees with plan. Patient is stable at time of discharge      Jeannetta Ellis, PA-C 12/20/12 1610

## 2012-12-20 NOTE — ED Provider Notes (Signed)
Medical screening examination/treatment/procedure(s) were performed by non-physician practitioner and as supervising physician I was immediately available for consultation/collaboration.  Doug Sou, MD 12/20/12 0700

## 2013-01-07 ENCOUNTER — Encounter (HOSPITAL_COMMUNITY): Payer: Self-pay | Admitting: Emergency Medicine

## 2013-01-07 DIAGNOSIS — I10 Essential (primary) hypertension: Secondary | ICD-10-CM | POA: Insufficient documentation

## 2013-01-07 DIAGNOSIS — F319 Bipolar disorder, unspecified: Secondary | ICD-10-CM | POA: Insufficient documentation

## 2013-01-07 DIAGNOSIS — R51 Headache: Secondary | ICD-10-CM | POA: Insufficient documentation

## 2013-01-07 DIAGNOSIS — Z9889 Other specified postprocedural states: Secondary | ICD-10-CM | POA: Insufficient documentation

## 2013-01-07 DIAGNOSIS — F17200 Nicotine dependence, unspecified, uncomplicated: Secondary | ICD-10-CM | POA: Insufficient documentation

## 2013-01-07 DIAGNOSIS — Z79899 Other long term (current) drug therapy: Secondary | ICD-10-CM | POA: Insufficient documentation

## 2013-01-07 NOTE — ED Notes (Addendum)
C/o headache since this morning.  Pt relates headache to being under a lot of stress at work.  Reports nausea and vomited x 1. Doesn't remember LMP.

## 2013-01-08 ENCOUNTER — Emergency Department (HOSPITAL_COMMUNITY)
Admission: EM | Admit: 2013-01-08 | Discharge: 2013-01-08 | Disposition: A | Discharge status: Home / Self Care | Payer: Medicaid Other | Attending: Emergency Medicine | Admitting: Emergency Medicine

## 2013-01-08 NOTE — ED Provider Notes (Signed)
CSN: [id number]     Arrival date & time 01/07/13  2204 History   First MD Initiated Contact with Patient 01/08/13 [phone]     Chief Complaint  Patient presents with  . Headache   (Consider location/radiation/quality/duration/timing/severity/associated sxs/prior Treatment) HPI Comments: 42 yo AA female with c/o HA which started at 1200 01/07/13.  Pt was "stressed out" and had just sent an email to work.  Not first, worst, no thunderclap, no trauma.  Pt has h/o Migraine HA.  Pt also has h/o Bipolar. Pt is out of her migraine HA medication.    No recent fever or chills.  LMP: August 2014; denies pregnancy.    A: depakote  Patient is a 42 y.o. female presenting with headaches. The history is provided by the patient.  Headache Pain location:  Generalized Radiates to:  Does not radiate Severity currently:  2/10 Severity at highest:  2/10 Onset quality:  Gradual Duration:  13 hours Timing:  Constant Progression:  Improving Chronicity:  Recurrent Similar to prior headaches: yes   Context: emotional stress   Context: not activity, not exposure to bright light, not caffeine, not coughing, not defecating, not eating, not exposure to cold air and not intercourse   Relieved by:  None tried Worsened by:  Nothing tried Associated symptoms: no abdominal pain, no back pain, no blurred vision, no congestion, no cough, no dizziness, no fever, no loss of balance, no myalgias, no nausea, no numbness, no seizures, no sinus pressure and no swollen glands   Risk factors: anger   Risk factors: no family hx of SAH, does not have insomnia and lifestyle not sedentary     Past Medical History  Diagnosis Date  . Hypertension   . Active smoker   . Hx of hernia repair   . Bipolar 1 disorder   . Depression    Past Surgical History  Procedure Laterality Date  . Cholecystectomy    . Hernia repair      4   . Ankle surgery      2 right ankle  . Tonsillectomy    . Tubal ligation     No family history on  file. History  Substance Use Topics  . Smoking status: Current Every Day Smoker -- 1.00 packs/day    Types: Cigarettes  . Smokeless tobacco: Not on file  . Alcohol Use: Yes     Comment: occasionally   OB History   Grav Para Term Preterm Abortions TAB SAB Ect Mult Living                 Review of Systems  Constitutional: Negative for fever, chills, diaphoresis, activity change and appetite change.  HENT: Negative for congestion and sinus pressure.   Eyes: Negative.  Negative for blurred vision.  Respiratory: Negative.  Negative for apnea, cough, choking and chest tightness.   Cardiovascular: Negative.   Gastrointestinal: Negative for nausea, abdominal pain, blood in stool, abdominal distention and anal bleeding.  Endocrine: Negative.   Genitourinary: Negative.   Musculoskeletal: Negative for myalgias and back pain.  Neurological: Positive for headaches. Negative for dizziness, tremors, seizures, syncope, facial asymmetry, speech difficulty, weakness, light-headedness, numbness and loss of balance.    Allergies  Depakote and Wellbutrin  Home Medications   Current Outpatient Rx  Name  Route  Sig  Dispense  Refill  . albuterol (PROVENTIL HFA;VENTOLIN HFA) 108 (90 BASE) MCG/ACT inhaler   Inhalation   Inhale 2 puffs into the lungs every 6 (six) hours as needed for  wheezing or shortness of breath.          . hydrochlorothiazide (MICROZIDE) 12.5 MG capsule   Oral   Take 12.5 mg by mouth daily.         . hydrOXYzine (VISTARIL) 25 MG capsule   Oral   Take 25 mg by mouth 3 (three) times daily as needed for anxiety. For anxiety         . lisinopril (PRINIVIL,ZESTRIL) 5 MG tablet   Oral   Take 1 tablet (5 mg total) by mouth every other day.   30 tablet   3   . topiramate (TOPAMAX) 50 MG tablet   Oral   Take 50 mg by mouth 2 (two) times daily.          BP 132/87  Pulse 87  Temp(Src) 98.3 F (36.8 C) (Oral)  Resp 18  SpO2 99% Physical Exam  Constitutional: She  is oriented to person, place, and time. She appears well-developed and well-nourished.  HENT:  Head: Normocephalic and atraumatic.  Right Ear: External ear normal.  Left Ear: External ear normal.  Nose: Nose normal.  Mouth/Throat: Oropharynx is clear and moist. No oropharyngeal exudate.  Eyes: Conjunctivae are normal. Pupils are equal, round, and reactive to light.  Neck: Normal range of motion. Neck supple. No JVD present. No tracheal deviation present. No thyromegaly present.  No meningismus, FAROM  Cardiovascular: Normal rate, regular rhythm and normal heart sounds.   Pulmonary/Chest: Effort normal and breath sounds normal. No stridor.  Abdominal: Soft. Bowel sounds are normal. She exhibits no distension and no mass. There is no tenderness. There is no rebound and no guarding.  Lymphadenopathy:    She has no cervical adenopathy.  Neurological: She is alert and oriented to person, place, and time. She has normal reflexes. She displays no atrophy, no tremor and normal reflexes. No cranial nerve deficit or sensory deficit. She exhibits normal muscle tone. She displays no seizure activity. Coordination normal. GCS eye subscore is 4. GCS verbal subscore is 5. GCS motor subscore is 6.  Skin: Skin is warm and dry.  Neuro: normal RAM, nl finger to nose  General: pt sleeping in NAD Head: no ttp at scalp or temporal artery  ED Course  Procedures (including critical care time) Labs Review Labs Reviewed - No data to display Imaging Review No results found.  MDM   1. Headache    HA - typical for patient.  Not first, worst, thunderclap.  No trauma.  No fever.  Pt had normal neurologic exam.    Pt has vistaril at home.  She did not want any Rx in the ER that would cause drowsiness because she drove.  She said she could take the vistaril when she gets home.  Doubt SAH, meningitis, ICH, disection, mass.  Suspect typical migrainous headache.  Pt to take vistaril at home.  ER precautions given.   Pt agrees with plan.      Darlys Gales, MD 01/08/13 [phone]

## 2013-01-09 ENCOUNTER — Emergency Department (EMERGENCY_DEPARTMENT_HOSPITAL)
Admission: EM | Admit: 2013-01-09 | Discharge: 2013-01-09 | Disposition: A | Discharge status: Home / Self Care | Payer: Medicaid Other | Source: Home / Self Care

## 2013-01-09 ENCOUNTER — Encounter (HOSPITAL_COMMUNITY): Payer: Self-pay

## 2013-01-09 ENCOUNTER — Inpatient Hospital Stay (HOSPITAL_COMMUNITY)
Admission: AD | Admit: 2013-01-09 | Discharge: 2013-01-13 | DRG: 885 | Disposition: A | Discharge status: Home / Self Care | Payer: Medicaid Other | Source: Intra-hospital | Attending: Psychiatry | Admitting: Psychiatry

## 2013-01-09 DIAGNOSIS — I10 Essential (primary) hypertension: Secondary | ICD-10-CM | POA: Diagnosis present

## 2013-01-09 DIAGNOSIS — F3110 Bipolar disorder, current episode manic without psychotic features, unspecified: Secondary | ICD-10-CM

## 2013-01-09 DIAGNOSIS — R45851 Suicidal ideations: Secondary | ICD-10-CM

## 2013-01-09 DIAGNOSIS — F17200 Nicotine dependence, unspecified, uncomplicated: Secondary | ICD-10-CM | POA: Diagnosis present

## 2013-01-09 DIAGNOSIS — Z3202 Encounter for pregnancy test, result negative: Secondary | ICD-10-CM | POA: Insufficient documentation

## 2013-01-09 DIAGNOSIS — F315 Bipolar disorder, current episode depressed, severe, with psychotic features: Secondary | ICD-10-CM

## 2013-01-09 DIAGNOSIS — Z79899 Other long term (current) drug therapy: Secondary | ICD-10-CM | POA: Insufficient documentation

## 2013-01-09 DIAGNOSIS — F3160 Bipolar disorder, current episode mixed, unspecified: Secondary | ICD-10-CM

## 2013-01-09 LAB — LITHIUM LEVEL: Lithium Lvl: <0.25 mEq/L — ABNORMAL LOW (ref 0.80–1.40)

## 2013-01-09 LAB — CBC WITH DIFFERENTIAL/PLATELET
Basophils Absolute: 0.1 10*3/uL (ref 0.0–0.1)
Eosinophils Relative: 2 % (ref 0–5)
HCT: 42.4 % (ref 36.0–46.0)
Lymphs Abs: 4.9 10*3/uL — ABNORMAL HIGH (ref 0.7–4.0)
MCH: 27 pg (ref 26.0–34.0)
MCV: 81.9 fL (ref 78.0–100.0)
Monocytes Absolute: 0.7 10*3/uL (ref 0.1–1.0)
Monocytes Relative: 5 % (ref 3–12)
Neutro Abs: 8.8 10*3/uL — ABNORMAL HIGH (ref 1.7–7.7)
RDW: 15.3 % (ref 11.5–15.5)
WBC: 14.8 10*3/uL — ABNORMAL HIGH (ref 4.0–10.5)

## 2013-01-09 LAB — RAPID URINE DRUG SCREEN, HOSP PERFORMED
Amphetamines: NOT DETECTED
Benzodiazepines: NOT DETECTED
Tetrahydrocannabinol: NOT DETECTED

## 2013-01-09 LAB — BASIC METABOLIC PANEL
BUN: 11 mg/dL (ref 6–23)
Chloride: 101 mEq/L (ref 96–112)
Glucose, Bld: 101 mg/dL — ABNORMAL HIGH (ref 70–99)
Potassium: 3.5 mEq/L (ref 3.5–5.1)

## 2013-01-09 LAB — POCT PREGNANCY, URINE: Preg Test, Ur: NEGATIVE

## 2013-01-09 MED ORDER — MAGNESIUM HYDROXIDE 400 MG/5ML PO SUSP: 30.0000 mL | Freq: Every day | ORAL | Status: DC | PRN

## 2013-01-09 MED ORDER — ZIPRASIDONE MESYLATE 20 MG IM SOLR
20.0000 mg | Freq: Once | INTRAMUSCULAR | Status: AC
  Administered 2013-01-09: 20 mg via INTRAMUSCULAR
  Filled 2013-01-09: qty 20

## 2013-01-09 MED ORDER — ALUM & MAG HYDROXIDE-SIMETH 200-200-20 MG/5ML PO SUSP
30.0000 mL | ORAL | Status: DC | PRN
  Administered 2013-01-11: 30 mL via ORAL

## 2013-01-09 MED ORDER — LISINOPRIL 5 MG PO TABS
5.0000 mg | ORAL_TABLET | Freq: Every day | ORAL | Status: DC
  Administered 2013-01-09: 5 mg via ORAL
  Filled 2013-01-09 (×2): qty 1

## 2013-01-09 MED ORDER — LORAZEPAM 1 MG PO TABS
1.0000 mg | ORAL_TABLET | Freq: Three times a day (TID) | ORAL | Status: DC | PRN
  Administered 2013-01-09: 1 mg via ORAL
  Filled 2013-01-09: qty 1

## 2013-01-09 MED ORDER — HYDROXYZINE HCL 25 MG PO TABS
25.0000 mg | ORAL_TABLET | Freq: Three times a day (TID) | ORAL | Status: DC | PRN
  Administered 2013-01-10 – 2013-01-12 (×7): 25 mg via ORAL

## 2013-01-09 MED ORDER — LORAZEPAM 1 MG PO TABS
2.0000 mg | ORAL_TABLET | Freq: Once | ORAL | Status: AC
  Administered 2013-01-09: 2 mg via ORAL
  Filled 2013-01-09 (×2): qty 2

## 2013-01-09 MED ORDER — TRAZODONE HCL 50 MG PO TABS
50.0000 mg | ORAL_TABLET | Freq: Every evening | ORAL | Status: DC | PRN
  Administered 2013-01-09 – 2013-01-13 (×5): 50 mg via ORAL
  Filled 2013-01-09: qty 28
  Filled 2013-01-09 (×5): qty 1

## 2013-01-09 MED ORDER — ACETAMINOPHEN 325 MG PO TABS: 650.0000 mg | ORAL_TABLET | ORAL | Status: DC | PRN

## 2013-01-09 MED ORDER — HYDROCHLOROTHIAZIDE 12.5 MG PO CAPS
12.5000 mg | ORAL_CAPSULE | Freq: Every day | ORAL | Status: DC
  Administered 2013-01-09: 18:00:00 via ORAL
  Filled 2013-01-09: qty 1

## 2013-01-09 MED ORDER — ALBUTEROL SULFATE HFA 108 (90 BASE) MCG/ACT IN AERS: 2.0000 | INHALATION_SPRAY | Freq: Four times a day (QID) | RESPIRATORY_TRACT | Status: DC | PRN

## 2013-01-09 MED ORDER — ONDANSETRON HCL 4 MG PO TABS: 4.0000 mg | ORAL_TABLET | Freq: Three times a day (TID) | ORAL | Status: DC | PRN

## 2013-01-09 MED ORDER — TOPIRAMATE 25 MG PO TABS
50.0000 mg | ORAL_TABLET | Freq: Two times a day (BID) | ORAL | Status: DC
  Administered 2013-01-10 – 2013-01-13 (×7): 50 mg via ORAL
  Filled 2013-01-09: qty 2
  Filled 2013-01-09: qty 56
  Filled 2013-01-09 (×3): qty 2
  Filled 2013-01-09: qty 56
  Filled 2013-01-09 (×5): qty 2

## 2013-01-09 MED ORDER — LITHIUM CARBONATE ER 450 MG PO TBCR
450.0000 mg | EXTENDED_RELEASE_TABLET | Freq: Two times a day (BID) | ORAL | Status: DC
  Administered 2013-01-10 – 2013-01-13 (×7): 450 mg via ORAL
  Filled 2013-01-09 (×6): qty 1
  Filled 2013-01-09: qty 28
  Filled 2013-01-09 (×2): qty 1
  Filled 2013-01-09: qty 28
  Filled 2013-01-09: qty 1

## 2013-01-09 MED ORDER — HYDROXYZINE HCL 25 MG PO TABS
25.0000 mg | ORAL_TABLET | Freq: Three times a day (TID) | ORAL | Status: DC | PRN
  Administered 2013-01-09: 25 mg via ORAL
  Filled 2013-01-09: qty 1

## 2013-01-09 MED ORDER — LISINOPRIL 5 MG PO TABS
5.0000 mg | ORAL_TABLET | Freq: Every day | ORAL | Status: DC
  Administered 2013-01-10 – 2013-01-13 (×4): 5 mg via ORAL
  Filled 2013-01-09 (×5): qty 1

## 2013-01-09 MED ORDER — LITHIUM CARBONATE ER 450 MG PO TBCR
450.0000 mg | EXTENDED_RELEASE_TABLET | Freq: Two times a day (BID) | ORAL | Status: DC
  Administered 2013-01-09 (×2): 450 mg via ORAL
  Filled 2013-01-09 (×4): qty 1

## 2013-01-09 MED ORDER — LORAZEPAM 1 MG PO TABS
2.0000 mg | ORAL_TABLET | Freq: Once | ORAL | Status: DC
  Filled 2013-01-09: qty 2

## 2013-01-09 MED ORDER — HYDROCHLOROTHIAZIDE 12.5 MG PO CAPS
12.5000 mg | ORAL_CAPSULE | Freq: Every day | ORAL | Status: DC
  Administered 2013-01-10 – 2013-01-13 (×4): 12.5 mg via ORAL
  Filled 2013-01-09 (×5): qty 1

## 2013-01-09 MED ORDER — TOPIRAMATE 25 MG PO TABS
50.0000 mg | ORAL_TABLET | Freq: Two times a day (BID) | ORAL | Status: DC
  Administered 2013-01-09 (×2): 50 mg via ORAL
  Filled 2013-01-09 (×2): qty 2

## 2013-01-09 MED ORDER — ALUM & MAG HYDROXIDE-SIMETH 200-200-20 MG/5ML PO SUSP: 30.0000 mL | ORAL | Status: DC | PRN

## 2013-01-09 NOTE — ED Provider Notes (Signed)
Medical screening examination/treatment/procedure(s) were performed by non-physician practitioner and as supervising physician I was immediately available for consultation/collaboration.   Cacie Gaskins L Aissata Wilmore, MD 01/09/13 0714 

## 2013-01-09 NOTE — ED Provider Notes (Signed)
CSN: [id number]     Arrival date & time 01/09/13  0013 History   None    Chief Complaint  Patient presents with  . Medical Clearance   (Consider location/radiation/quality/duration/timing/severity/associated sxs/prior Treatment) HPI History provided by pt.   Pt brought to ED by [employer] voluntarily for suicidal ideation since this evening.  Has been feeling very depressed recently and attributes to problems at work.  She has also been auditory hallucinations.  Hears conversations in her head.  The voices have not spoken to her directly, and specifically, have not told her to kill herself.  No plan.  Denies HI.  Denies drug/alcohol abuse.  Pt has a h/o bipolar disorder and has recently been started on topamax.  Does not recall exact start date.   Past Medical History  Diagnosis Date  . Hypertension   . Active smoker   . Hx of hernia repair   . Bipolar 1 disorder   . Depression    Past Surgical History  Procedure Laterality Date  . Cholecystectomy    . Hernia repair      4   . Ankle surgery      2 right ankle  . Tonsillectomy    . Tubal ligation     History reviewed. No pertinent family history. History  Substance Use Topics  . Smoking status: Current Every Day Smoker -- 1.00 packs/day    Types: Cigarettes  . Smokeless tobacco: Not on file  . Alcohol Use: Yes     Comment: occasionally   OB History   Grav Para Term Preterm Abortions TAB SAB Ect Mult Living                 Review of Systems  All other systems reviewed and are negative.    Allergies  Depakote and Wellbutrin  Home Medications   Current Outpatient Rx  Name  Route  Sig  Dispense  Refill  . albuterol (PROVENTIL HFA;VENTOLIN HFA) 108 (90 BASE) MCG/ACT inhaler   Inhalation   Inhale 2 puffs into the lungs every 6 (six) hours as needed for wheezing or shortness of breath.          . hydrochlorothiazide (MICROZIDE) 12.5 MG capsule   Oral   Take 12.5 mg by mouth daily.         . hydrOXYzine (VISTARIL) 25  MG capsule   Oral   Take 25 mg by mouth 3 (three) times daily as needed for anxiety. For anxiety         . lisinopril (PRINIVIL,ZESTRIL) 5 MG tablet   Oral   Take 1 tablet (5 mg total) by mouth every other day.   30 tablet   3   . topiramate (TOPAMAX) 50 MG tablet   Oral   Take 50 mg by mouth 2 (two) times daily.          BP 128/68  Pulse 90  Temp(Src) 98.4 F (36.9 C) (Oral)  Resp 18  SpO2 96% Physical Exam  Nursing note and vitals reviewed. Constitutional: She is oriented to person, place, and time. She appears well-developed and well-nourished. No distress.  HENT:  Head: Normocephalic and atraumatic.  Eyes:  Normal appearance  Neck: Normal range of motion.  Pulmonary/Chest: Effort normal.  Musculoskeletal: Normal range of motion.  Neurological: She is alert and oriented to person, place, and time.  Psychiatric:  Pt stares and is slow to respond to questions.  She is cooperative but states "I know your only doing your job, but  your questions are irritating me."  Does not appear depressed.      ED Course  Procedures (including critical care time) Labs Review Labs Reviewed  CBC WITH DIFFERENTIAL - Abnormal; Notable for the following:    WBC 14.8 (*)    RBC 5.18 (*)    Neutro Abs 8.8 (*)    Lymphs Abs 4.9 (*)    All other components within normal limits  BASIC METABOLIC PANEL - Abnormal; Notable for the following:    Glucose, Bld 101 (*)    GFR calc non Af Amer 73 (*)    GFR calc Af Amer 85 (*)    All other components within normal limits  ETHANOL  URINE RAPID DRUG SCREEN (HOSP PERFORMED)  POCT PREGNANCY, URINE   Imaging Review No results found.  MDM  No diagnosis found. 42yo F w/ bipolar disorder, recently started on topamax by her psychiatrist and compliant w/ this medication, presents w/ suicidality and psychosis.  Medically cleared.  Holding orders written and patient moved to psych ED.  Though she came voluntarily, I do believe that patient is a  danger to herself and should not be allowed to leave, at least until assessed by psych.      Otilio Miu, PA-C 01/09/13 [phone]
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
## 2013-01-09 NOTE — ED Notes (Signed)
Pt resting, asleep. Breathing intake.

## 2013-01-09 NOTE — ED Notes (Signed)
Pt standing outside her room yelling, "I want to leave now!" Security notified. [employer] and security talking to pt. Charge notified.

## 2013-01-09 NOTE — ED Notes (Signed)
Patient is resting comfortably. 
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
## 2013-01-09 NOTE — Tx Team (Signed)
Initial Interdisciplinary Treatment Plan  PATIENT STRENGTHS: (choose at least two) General fund of knowledge  PATIENT STRESSORS: Medication change or noncompliance Occupational concerns   PROBLEM LIST: Problem List/Patient Goals Date to be addressed Date deferred Reason deferred Estimated date of resolution  SI 01/09/13     Depression 01/09/13                                                DISCHARGE CRITERIA:  Improved stabilization in mood, thinking, and/or behavior Medical problems require only outpatient monitoring  PRELIMINARY DISCHARGE PLAN: Attend aftercare/continuing care group Outpatient therapy  PATIENT/FAMIILY INVOLVEMENT: This treatment plan has been presented to and reviewed with the patient, Alyssa Prince.  The patient and family have been given the opportunity to ask questions and make suggestions.  Jacques Navy A 01/09/2013, 11:49 PM

## 2013-01-09 NOTE — ED Notes (Signed)
Father:  Marina Goodell [phone]
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
## 2013-01-09 NOTE — ED Notes (Signed)
Sitter at bedside. Patient lying in bed resting with the lights dimmed. Expresses no other needs at this time. Blood pressure and pulse ox monitoring devices on patient. Denies pain at this time. Will continue to monitor patient.

## 2013-01-09 NOTE — [facility] Counselor (Signed)
No beds [facility] - Susan - no beds [facility] - Katlynn - no beds [facility] - Alvita - no beds  [facility] - Katlynn - no beds [facility] - Delta - no beds [facility] GiGi - no beds [facility] Melanie - no beds [facility] - Danny - no beds [facility] - Miriam - no beds [facility] - no answer  Possibilities: [facility] - Pam - possible placement options [facility] - Neal no adult beds; 1 gero psych bed; low acuity [facility] - Lynn - possible beds but low acuity [facility] - Bernadine - a few female beds - low acuity [facility] - Charles - case by case ( they usually don't admit on Sundays) 
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
## 2013-01-09 NOTE — Consult Note (Signed)
Reason for Consult: Bipolar disorder with suicidal ideations Referring Physician: ER Physician  Alyssa Prince is an 42 y.o. female.  HPI: Patient was brought in to [facility] by [employer]. Patient had said that she was thinking about killing herself. Patient currently denies this. She has no current plan. No HI reported either. Patient his having some significant deficits in functioning however. She is hearing voices but says that they are all "scattered." Her thoughts are blunted and her speech is pressured. She perseverates on whether her supervisor at the [employer] was discriminating against her for not putting her on the work schedule. She talks about how she took the company to court over it. During the assessment she takes a few seconds to respond to questions and then it may well be back to the subject of this work conflict. Patient says that she "is emotionally & mentally stressed out to the point that I cannot take care of myself." Patient complains that her father cannot take care of her 78 year old son and calls her about him often. Son is currently staying with patient's father. She has delayed responses and appears to be listening to internal stimuli. Patient was discharged from [facility] on 12/10/12. She currently is supposed to be seeing therapists at [facility] and she says she has gone there twice. She is dissatisfied with them because she thinks they are discriminating against her too. Patient is unable to care for herself adequately and is currently in need of inpatient care.  Axis I: Bipolar, mixed Axis II: Deferred Axis III:  Past Medical History  Diagnosis Date  . Hypertension   . Active smoker   . Hx of hernia repair   . Bipolar 1 disorder   . Depression    Axis IV: occupational problems, other psychosocial or environmental problems and problems related to social environment Axis V: 51-60 moderate symptoms   Psychiatric Specialty Exam: @PHYSEXAMBYAGE2 @  @ROS @  Blood pressure  102/67, pulse 76, temperature 98 F (36.7 C), temperature source Oral, resp. rate 24, SpO2 97.00%.There is no weight on file to calculate BMI.  General Appearance: Casual and Disheveled  Eye Contact::  Good  Speech:  Clear and Coherent and Slow  Volume:  Normal  Mood:  Anxious, Depressed and Tearful  Affect:  Depressed  Thought Process:  Circumstantial  Orientation:  Full (Time, Place, and Person)  Thought Content:  Rumination  Suicidal Thoughts:  No  Homicidal Thoughts:  No  Memory:  Immediate;   Fair Recent;   Fair Remote;   Fair  Judgement:  Fair  Insight:  Fair  Psychomotor Activity:  Normal  Concentration:  Fair  Recall:  Fair  Akathisia:  No  Handed:  Right  AIMS (if indicated):     Assets:  Desire for Improvement Housing  Sleep:       Past Medical History  Diagnosis Date  . Hypertension   . Active smoker   . Hx of hernia repair   . Bipolar 1 disorder   . Depression     Past Surgical History  Procedure Laterality Date  . Cholecystectomy    . Hernia repair      4   . Ankle surgery      2 right ankle  . Tonsillectomy    . Tubal ligation      History reviewed. No pertinent family history.  Social History:  reports that she has been smoking Cigarettes.  She has been smoking about 1.00 pack per day. She  does not have any smokeless tobacco history on file. She reports that  drinks alcohol. She reports that she does not use illicit drugs.  Allergies:  Allergies  Allergen Reactions  . Depakote [Divalproex Sodium] Diarrhea and Other (See Comments)    Extreme weight  . Wellbutrin [Bupropion]     dizziness    Medications: I have reviewed the patient's current medications.  Results for orders placed during the hospital encounter of 01/09/13 (from the past 48 hour(s))  CBC WITH DIFFERENTIAL     Status: Abnormal   Collection Time    01/09/13  1:00 AM      Result Value Range   WBC 14.8 (*) 4.0 - 10.5 K/uL   Comment: RESULT REPEATED AND VERIFIED   RBC 5.18  (*) 3.87 - 5.11 MIL/uL   Hemoglobin 14.0  12.0 - 15.0 g/dL   HCT 16.1  09.6 - 04.5 %   MCV 81.9  78.0 - 100.0 fL   MCH 27.0  26.0 - 34.0 pg   MCHC 33.0  30.0 - 36.0 g/dL   RDW 40.9  81.1 - 91.4 %   Platelets 368  150 - 400 K/uL   Neutrophils Relative % 59  43 - 77 %   Lymphocytes Relative 33  12 - 46 %   Monocytes Relative 5  3 - 12 %   Eosinophils Relative 2  0 - 5 %   Basophils Relative 1  0 - 1 %   Neutro Abs 8.8 (*) 1.7 - 7.7 K/uL   Lymphs Abs 4.9 (*) 0.7 - 4.0 K/uL   Monocytes Absolute 0.7  0.1 - 1.0 K/uL   Eosinophils Absolute 0.3  0.0 - 0.7 K/uL   Basophils Absolute 0.1  0.0 - 0.1 K/uL   WBC Morphology WHITE COUNT CONFIRMED ON SMEAR     Smear Review LARGE PLATELETS PRESENT     Comment: GIANT PLATELETS SEEN  BASIC METABOLIC PANEL     Status: Abnormal   Collection Time    01/09/13  1:00 AM      Result Value Range   Sodium 138  135 - 145 mEq/L   Potassium 3.5  3.5 - 5.1 mEq/L   Chloride 101  96 - 112 mEq/L   CO2 23  19 - 32 mEq/L   Glucose, Bld 101 (*) 70 - 99 mg/dL   BUN 11  6 - 23 mg/dL   Creatinine, Ser 7.82  0.50 - 1.10 mg/dL   Calcium 9.8  8.4 - 95.6 mg/dL   GFR calc non Af Amer 73 (*) >90 mL/min   GFR calc Af Amer 85 (*) >90 mL/min   Comment: (NOTE)     The eGFR has been calculated using the CKD EPI equation.     This calculation has not been validated in all clinical situations.     eGFR's persistently <90 mL/min signify possible Chronic Kidney     Disease.  ETHANOL     Status: None   Collection Time    01/09/13  1:00 AM      Result Value Range   Alcohol, Ethyl (B) <11  0 - 11 mg/dL   Comment:            LOWEST DETECTABLE LIMIT FOR     SERUM ALCOHOL IS 11 mg/dL     FOR MEDICAL PURPOSES ONLY  URINE RAPID DRUG SCREEN (HOSP PERFORMED)     Status: None   Collection Time    01/09/13  1:21 AM  Result Value Range   Opiates NONE DETECTED  NONE DETECTED   Cocaine NONE DETECTED  NONE DETECTED   Benzodiazepines NONE DETECTED  NONE DETECTED   Amphetamines  NONE DETECTED  NONE DETECTED   Tetrahydrocannabinol NONE DETECTED  NONE DETECTED   Barbiturates NONE DETECTED  NONE DETECTED   Comment:            DRUG SCREEN FOR MEDICAL PURPOSES     ONLY.  IF CONFIRMATION IS NEEDED     FOR ANY PURPOSE, NOTIFY LAB     WITHIN 5 DAYS.                LOWEST DETECTABLE LIMITS     FOR URINE DRUG SCREEN     Drug Class       Cutoff (ng/mL)     Amphetamine      1000     Barbiturate      200     Benzodiazepine   200     Tricyclics       300     Opiates          300     Cocaine          300     THC              50  POCT PREGNANCY, URINE     Status: None   Collection Time    01/09/13  1:28 AM      Result Value Range   Preg Test, Ur NEGATIVE  NEGATIVE   Comment:            THE SENSITIVITY OF THIS     METHODOLOGY IS >24 mIU/mL  LITHIUM LEVEL     Status: Abnormal   Collection Time    01/09/13 10:25 AM      Result Value Range   Lithium Lvl <0.25 (*) 0.80 - 1.40 mEq/L   Comment: REPEATED TO VERIFY    No results found.  Positive for anxiety, bipolar and depression Blood pressure 110/70, pulse 81, temperature 98 F (36.7 C), temperature source Oral, resp. rate 24, SpO2 97.00%.   Assessment/Plan: Bipolar disorder, MRE is mixed  Recommend in patient psych hospitalization for crisis stabilization and start home medication May evaluated in am if clinically improves   Jakeya Gherardi,JANARDHAHA R. 01/09/2013, 1:17 PM

## 2013-01-09 NOTE — ED Notes (Signed)
Marcus with TTS at bedside. 

## 2013-01-09 NOTE — ED Notes (Signed)
Pt has (L) eyebrow piercing and a (R) lip ring, pt sts that she is not able to take out the piercings

## 2013-01-09 NOTE — ED Notes (Signed)
Report given via telephone to Psych ED RN

## 2013-01-09 NOTE — BH Assessment (Signed)
[facility] Assessment Progress Note      Patient has been referred to [facility], as they have a female bed available. Referral faxed to [facility].  Shon Baton, LCSW, LCASA, CSW-G

## 2013-01-09 NOTE — ED Notes (Signed)
Pt has been agitated since 0745 yelling and being loud, slightly verbally aggressive, md made aware and pt given ativan at 0800, md told charge to alert her if pt continued to be agitated. Pt at desk yelling stating she is agitated, pt reports "her spirit is telling her someone is breaking into her house". [employer] checked and there were no calls to 911 from pts address. Charge alerted me, geodon ordered. Pt was cooperative and took medication. Pt still says her spirit tells her someone is breaking into her home. Charge encouraged pt to pray a prayer of safety over her home. [employer] at bedside, pt wanted to talk to [facility] privately with sitter in room. Pt told she could make a phone call in her room with portable phone.

## 2013-01-09 NOTE — ED Notes (Signed)
Nursing Admit note received pt from E.R pt was sedated , arouseable but unable to answer questions. Pt is cooperative, B/P 110/70 81 R-24

## 2013-01-10 DIAGNOSIS — F3160 Bipolar disorder, current episode mixed, unspecified: Principal | ICD-10-CM

## 2013-01-10 MED ORDER — NICOTINE POLACRILEX 2 MG MT GUM
2.0000 mg | CHEWING_GUM | OROMUCOSAL | Status: DC | PRN
  Administered 2013-01-10 – 2013-01-13 (×7): 2 mg via ORAL

## 2013-01-10 NOTE — Progress Notes (Signed)
Adult Psychoeducational Group Note  Date:  01/10/2013 Time:  10:37 PM  Group Topic/Focus:  Goals Group:   The focus of this group is to help patients establish daily goals to achieve during treatment and discuss how the patient can incorporate goal setting into their daily lives to aide in recovery.  Participation Level:  Active  Participation Quality:  Appropriate  Affect:  Appropriate  Cognitive:  Appropriate  Insight: Appropriate  Engagement in Group:  Engaged  Modes of Intervention:  Discussion  Additional Comments:  Pt stated that her day was ok, but needs to get some things off her chest.  Aldona Lento 01/10/2013, 10:37 PM

## 2013-01-10 NOTE — Progress Notes (Signed)
Recreation Therapy Notes  Date: 09.15.2014 Time: 3:00pm Location: [address] Dayroom  Group Topic: Coping Skills  Goal Area(s) Addresses:  Patient will verbalize importance of recognizing emotions. Patient will identify at least one emotion. Patient will successfully represent varying emotions in pictures or words.   Behavioral Response: Appropriate  Intervention: Art  Activity: Emotion Wheel. As a group patients identified 8 emotions. Using the provided worksheet patients were asked to represent emotions identified by group in pictures of words.    Education: Emotional Recognition, Emotional Regulation, Coping Skills  Education Outcome: Acknowledges Understanding & In group clarification offered.    Clinical Observations/Feedback: Patient attended group session, but did not participate. Patient remained in group space during session and ate an orange. At approximately 3:20 patient exited group session without explanation, patient did not return.   Marykay Lex Jenice Leiner, LRT/CTRS  Jearl Klinefelter 01/10/2013 4:59 PM

## 2013-01-10 NOTE — [facility] Group Notes (Signed)
[facility] LCSW Group Therapy  01/10/2013  1:15 PM   Type of Therapy:  Group Therapy  Participation Level:  Active  Participation Quality:  Appropriate and Attentive  Affect:  Appropriate, Distrated  Cognitive:  Distracted  Insight:  Limited  Engagement in Therapy:  Limited  Modes of Intervention:  Clarification, Confrontation, Discussion, Education, Exploration, Limit-setting, Orientation, Problem-solving, Rapport Building, Dance movement psychotherapist, Socialization and Support  Summary of Progress/Problems: Pt identified obstacles faced currently and processed barriers involved in overcoming these obstacles. Pt identified steps necessary for overcoming these obstacles and explored motivation (internal and external) for facing these difficulties head on. Pt further identified one area of concern in their lives and chose a goal to focus on for today.  Pt was attentive to group discussion but when called upon she is slow to respond and appears to be distracted with internal stimuli.  Pt shared that her biggest obstacle is getting herself back together, which includes being happy.  Pt states that she is taking it "one day at a time".   Alyssa Prince, LCSWA 01/10/2013 2:42 PM

## 2013-01-10 NOTE — Progress Notes (Signed)
Patient ID: Alyssa Prince, female   DOB: Feb 10, 1971, 42 y.o.   MRN: [id number] D- Patient reports poor sleep last night and improving appetite.  Her energy level is low and her ability to pay attention is improving.  She rated her depression at 2/10 and then said late in morning it was 4/10.  Supported patient.  R- Patient says she hears voices in her head, they sound like people talking and she cannot understand what they say.  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

## 2013-01-10 NOTE — [facility] Suicide Risk Assessment (Signed)
Suicide Risk Assessment  Admission Assessment     Nursing information obtained from:    Demographic factors:    Current Mental Status:    Loss Factors:    Historical Factors:    Risk Reduction Factors:     CLINICAL FACTORS:   Severe Anxiety and/or Agitation Panic Attacks Bipolar Disorder:   Mixed State Depression:   Aggression Anhedonia Hopelessness Impulsivity Insomnia Recent sense of peace/wellbeing Severe Unstable or Poor Therapeutic Relationship Previous Psychiatric Diagnoses and Treatments Medical Diagnoses and Treatments/Surgeries  COGNITIVE FEATURES THAT CONTRIBUTE TO RISK:  Closed-mindedness Loss of executive function Polarized thinking Thought constriction (tunnel vision)    SUICIDE RISK:   Moderate:  Frequent suicidal ideation with limited intensity, and duration, some specificity in terms of plans, no associated intent, good self-control, limited dysphoria/symptomatology, some risk factors present, and identifiable protective factors, including available and accessible social support.  PLAN OF CARE: Patient admitted voluntarily and emergently for bipolar disorder, mixed symptoms.   I certify that inpatient services furnished can reasonably be expected to improve the patient's condition.  Jennipher Weatherholtz,JANARDHAHA R. 01/10/2013, 12:11 PM

## 2013-01-10 NOTE — Tx Team (Signed)
Interdisciplinary Treatment Plan Update (Adult)  Date: 01/10/2013  Time Reviewed:  9:45 AM  Progress in Treatment: Attending groups: Yes Participating in groups:  Yes Taking medication as prescribed:  Yes Tolerating medication:  Yes Family/Significant othe contact made: CSW assessing  Patient understands diagnosis:  Yes Discussing patient identified problems/goals with staff:  Yes Medical problems stabilized or resolved:  Yes Denies suicidal/homicidal ideation: Yes Issues/concerns per patient self-inventory:  Yes Other:  New problem(s) identified: N/A  Discharge Plan or Barriers: CSW assessing for appropriate referrals.  Reason for Continuation of Hospitalization: Anxiety Depression Medication Stabilization  Comments: N/A  Estimated length of stay: 3-5 days  For review of initial/current patient goals, please see plan of care.  Attendees: Patient:     Family:     Physician:  Dr. Johnalagadda 01/10/2013 12:40 PM   Nursing:   Carol Davis, RN 01/10/2013 12:40 PM   Clinical Social Worker:  Layliana Devins Horton, LCSWA 01/10/2013 12:40 PM   Other: Neil Mashburn, PA 01/10/2013 12:40 PM   Other:   Other:    Other:     Other:    Other:    Other:    Other:    Other:    Other:     Scribe for Treatment Team:   Horton, Kyjuan Gause Nicole, 01/10/2013 12:40 PM   

## 2013-01-10 NOTE — [facility] Group Notes (Signed)
[facility] LCSW Aftercare Discharge Planning Group Note   01/10/2013 8:45 AM  Participation Quality:  Alert and Appropriate   Mood/Affect:  Appropriate, Distraction  Depression Rating:  6-7  Anxiety Rating:  6-7  Thoughts of Suicide:  Pt denies SI/HI  Will you contract for safety?   Yes  Current AVH:  Pt denies   Plan for Discharge/Comments:  Pt attended discharge planning group and actively participated in group.  CSW provided pt with today's workbook.  Pt reports coming to the hospital for depression, stress at work and in the neighborhood.  Pt states that she has followed up at [facility] in the past but doesn't want to return there.  CSW will make appropriate referrals.  Pt was slow to respond and needed prompting to answer.  No further needs voiced by pt at this time.    Transportation Means:  Pt reports access to transportation  Supports: No supports mentioned  Alyssa Prince, LCSWA 01/10/2013 9:55 AM   Horton, Salome Arnt
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
## 2013-01-10 NOTE — Progress Notes (Addendum)
D: Pt is endorsing mild depression with anxiety rated at level 6 out of 10. Pt requested vistaril to be given for her anxiety. Vistaril administered per orders. Writer ordered and administered nicotine for her nicotine cravings. Pt is currently denying any SI/HI. Pt is observed interacting appropriately within the milieu. Pt present for group.  A: Writer administered scheduled and prn medications to pt. Continued support and availability as needed was extended to this pt. Staff continue to monitor pt with q34min checks.  R: No adverse drug reactions noted. Pt receptive to treatment. Pt remains safe at this time.

## 2013-01-10 NOTE — [facility] Counselor (Signed)
Adult Psychosocial Assessment Update Interdisciplinary Team  [facility] admissions[facility]discharges:  Admissions Discharges  Date: 12/06/12 Date: 12/09/12  Date: Date:  Date: Date:  Date: Date:  Date: Date:   Changes since the last Psychosocial Assessment (including adherence to outpatient mental health and/or substance abuse treatment, situational issues contributing to decompensation and/or relapse). Today pt presents preoccupied and is slow to respond to questions.  Pt states that she has been dealing with a lot of stress at work and in her neighborhood.  Pt reports following up with recommendations since last admission.               Discharge Plan 1. Will you be returning to the same living situation after discharge?   Yes: X Returning home No:      If no, what is your plan?           2. Would you like a referral for services when you are discharged? Yes: X     If yes, for what services? Pt states that she has been followed by [facility] but no longer wants to follow up there.  Pt will need a referral for medication management and therapy.    No:              Summary and Recommendations (to be completed by the evaluator) Patient is a 42 year old African American female with a diagnosis of Bipolar Disorder - Depressed.  Patient lives in [place] alone.  Patient will benefit from crisis stabilization, medication evaluation, group therapy and psycho education in addition to case management for discharge planning.                         Signature:  [name], 01/10/2013 12:29 PM

## 2013-01-11 DIAGNOSIS — F3110 Bipolar disorder, current episode manic without psychotic features, unspecified: Secondary | ICD-10-CM

## 2013-01-11 NOTE — [facility] Suicide Risk Assessment (Signed)
[facility] INPATIENT:  Family/Significant Other Suicide Prevention Education  Suicide Prevention Education:  Education Completed; Shonice Wrisley, Father, [phone];  has been identified by the patient as the family member/significant other with whom the patient will be residing, and identified as the person(s) who will aid the patient in the event of a mental health crisis (suicidal ideations/suicide attempt).  With written consent from the patient, the family member/significant other has been provided the following suicide prevention education, prior to the and/or following the discharge of the patient.  The suicide prevention education provided includes the following:  Suicide risk factors  Suicide prevention and interventions  National Suicide Hotline telephone number  [facility] assessment telephone number  [facility] Emergency Assistance 911  [facility] and/or Residential Mobile Crisis Unit telephone number  Request made of family/significant other to:  Remove weapons (e.g., guns, rifles, knives), all items previously/currently identified as safety concern. Father advised patient does not have access to guns.  Remove drugs/medications (over-the-counter, prescriptions, illicit drugs), all items previously/currently identified as a safety concern.  The family member/significant other verbalizes understanding of the suicide prevention education information provided.  The family member/significant other agrees to remove the items of safety concern listed above.  Wynn Banker 01/11/2013, 10:57 AM

## 2013-01-11 NOTE — Progress Notes (Signed)
[facility] MD Progress Note  01/11/2013 1:30 PM Alyssa Prince  MRN:  [id number] Subjective:  Patient complaints of feeling not good, continues to be moody, irritable, disturbance of sleep and compliant with her medication. She has stated that she feels fine or okay, when asked to give me details about it, she said she does not know. She is worried about where she is going to live after discharge because she does not want to go back to previous place because of her neighbor and coworker are jealous about her. Her son was placed at his dad's care because she can't care for him at this time. She also reported her psychiatrist from [facility] asked her to stop her lithium and she stopped it before she become more disorganized and manic at this time.   Diagnosis:   DSM5: Schizophrenia Disorders:   Obsessive-Compulsive Disorders:   Trauma-Stressor Disorders:   Substance/Addictive Disorders:   Depressive Disorders:    Axis I: Bipolar, Manic  ADL's:  Impaired  Sleep: Poor  Appetite:  Poor  Suicidal Ideation:  Patient is unable to provide the information due to disorganized and severe thought blocking Homicidal Ideation:  Patient is unable to provide the information due to disorganized and severe thought blocking AEB (as evidenced by):  Psychiatric Specialty Exam: ROS  Blood pressure 139/82, pulse 88, temperature 97.6 F (36.4 C), temperature source Oral, resp. rate 16, height 4' 11.65" (1.515 m), weight 118.842 kg (262 lb).Body mass index is 51.78 kg/(m^2).  General Appearance: Bizarre, Disheveled and Guarded  Eye Contact::  Minimal  Speech:  Blocked and Slow  Volume:  Decreased  Mood:  Anxious, Euphoric and Irritable  Affect:  Non-Congruent and Labile  Thought Process:  Disorganized, Irrelevant, Loose and Tangential  Orientation:  Full (Time, Place, and Person)  Thought Content:  Paranoid Ideation  Suicidal Thoughts:  No  Homicidal Thoughts:  No  Memory:  Immediate;   Fair   Judgement:  Impaired  Insight:  Lacking  Psychomotor Activity:  Psychomotor Retardation and Restlessness  Concentration:  Fair  Recall:  Fair  Akathisia:  NA  Handed:  Right  AIMS (if indicated):     Assets:  Communication Skills Desire for Improvement Physical Health Resilience Social Support  Sleep:  Number of Hours: 4.5   Current Medications: Current Facility-Administered Medications  Medication Dose Route Frequency Provider Last Rate Last Dose  . acetaminophen (TYLENOL) tablet 650 mg  650 mg Oral Q4H PRN Court Joy, PA-C      . albuterol (PROVENTIL HFA;VENTOLIN HFA) 108 (90 BASE) MCG/ACT inhaler 2 puff  2 puff Inhalation Q6H PRN Court Joy, PA-C      . alum & mag hydroxide-simeth (MAALOX/MYLANTA) 200-200-20 MG/5ML suspension 30 mL  30 mL Oral PRN Court Joy, PA-C      . hydrochlorothiazide (MICROZIDE) capsule 12.5 mg  12.5 mg Oral Daily Court Joy, PA-C   12.5 mg at 01/11/13 0754  . hydrOXYzine (ATARAX/VISTARIL) tablet 25 mg  25 mg Oral TID PRN Court Joy, PA-C   25 mg at 01/11/13 1002  . lisinopril (PRINIVIL,ZESTRIL) tablet 5 mg  5 mg Oral Daily Court Joy, PA-C   5 mg at 01/11/13 0754  . lithium carbonate (ESKALITH) CR tablet 450 mg  450 mg Oral Q12H Court Joy, PA-C   450 mg at 01/11/13 0754  . magnesium hydroxide (MILK OF MAGNESIA) suspension 30 mL  30 mL Oral Daily PRN Court Joy, PA-C      .  nicotine polacrilex (NICORETTE) gum 2 mg  2 mg Oral PRN Nehemiah Settle, MD   2 mg at 01/11/13 0935  . ondansetron (ZOFRAN) tablet 4 mg  4 mg Oral Q8H PRN Court Joy, PA-C      . topiramate (TOPAMAX) tablet 50 mg  50 mg Oral BID Court Joy, PA-C   50 mg at 01/11/13 0754  . traZODone (DESYREL) tablet 50 mg  50 mg Oral QHS PRN Court Joy, PA-C   50 mg at 01/09/13 2304    Lab Results: No results found for this or any previous visit (from the past 48 hour(s)).  Physical Findings: AIMS: Facial and Oral Movements Muscles of  Facial Expression: None, normal Lips and Perioral Area: None, normal Jaw: None, normal Tongue: None, normal,Extremity Movements Upper (arms, wrists, hands, fingers): None, normal Lower (legs, knees, ankles, toes): None, normal, Trunk Movements Neck, shoulders, hips: None, normal, Overall Severity Severity of abnormal movements (highest score from questions above): None, normal Incapacitation due to abnormal movements: None, normal Patient's awareness of abnormal movements (rate only patient's report): No Awareness, Dental Status Current problems with teeth and/or dentures?: No Does patient usually wear dentures?: No  CIWA:    COWS:     Treatment Plan Summary: Daily contact with patient to assess and evaluate symptoms and progress in treatment Medication management  Plan: Restart her medication Lithium and Topamax and will monitor for adverse effects Continue home medications Monitor lithium level and CBC with differential Treatment Plan/Recommendations:  1. Admit for crisis management and stabilization. 2. Medication management to reduce current symptoms to base line and improve the patient's overall level of functioning. 3. Treat health problems as indicated. 4. Develop treatment plan to decrease risk of relapse upon discharge and to reduce the need for readmission. 5. Psycho-social education regarding relapse prevention and self care. 6. Health care follow up as needed for medical problems. 7. Restart home medications where appropriate.   Medical Decision Making Problem Points:  Established problem, worsening (2), Review of last therapy session (1) and Review of psycho-social stressors (1) Data Points:  Review or order clinical lab tests (1) Review or order medicine tests (1) Review of medication regiment & side effects (2) Review of new medications or change in dosage (2)  I certify that inpatient services furnished can reasonably be expected to improve the patient's  condition.   Keionna Kinnaird,JANARDHAHA R. 01/11/2013, 1:30 PM

## 2013-01-11 NOTE — Progress Notes (Signed)
Didn't attend group 

## 2013-01-11 NOTE — Progress Notes (Signed)
Recreation Therapy Notes  Date: 09.16.2014 Time: 2:45pm Location: [address] Dayroom  Group Topic: Software engineer Activities (AAA)  Behavioral Response: Engaged, Appropriate  Affect: Euthymic  Clinical Observations/Feedback: Dog Team: [employer] Patient interacted appropriately with peer, dog team, LRT and MHT.   Marykay Lex Latorya Bautch, LRT/CTRS  Jearl Klinefelter 01/11/2013 4:47 PM

## 2013-01-11 NOTE — Progress Notes (Signed)
Patient did not attend 0900 RN group. 

## 2013-01-11 NOTE — Progress Notes (Signed)
Adult Psychoeducational Group Note  Date:  01/11/2013 Time:  11:00am Group Topic/Focus:  Recovery Goals:   The focus of this group is to identify appropriate goals for recovery and establish a plan to achieve them.  Participation Level:  Active  Participation Quality:  Appropriate and Attentive  Affect:  Appropriate  Cognitive:  Alert and Appropriate  Insight: Appropriate  Engagement in Group:  Engaged  Modes of Intervention:  Discussion and Education  Additional Comments:  Pt attended and participated in group. When asked what recovery means to her pt stated healing.  Shelly Bombard D 01/11/2013, 1:41 PM

## 2013-01-11 NOTE — Progress Notes (Signed)
D: Pt states she slept fair, appetite is good, energy level low. Pt rates her depression and hopelessness at a 2, with 10 being the worst depression and hopelessness. Pt denies SI/HI. A: Pt pleasant, cooperative, has poor attention span at times. Pt going to groups.  Pt anxious at times. Allowed pt to verbalize feelings of anxiety. 15 minute checks R: Pt states she has no place to go after discharge, and this is what is making her anxious. Pt denies SI, HI. Safety maintained.

## 2013-01-11 NOTE — [facility] Group Notes (Signed)
[facility] LCSW Group Therapy            Feelings About Diagnosis 1:15 - 2:30 PM          01/11/2013 3:12 PM     Type of Therapy:  Group Therapy  Participation Level: Minimal  Participation Quality:  Appropriate  Affect:  Appropriate  Cognitive:   Appropriate  Insight:  Developing/Improving   Engagement in Therapy:  Developing/Improving   Modes of Intervention:  Discussion, Education, Exploration, Problem-Solving, Rapport Building, Support  Summary of Progress/Problems:  Patient listened attentively but did not participate in discussion.  Wynn Banker 01/11/2013  3:12 PM

## 2013-01-12 MED ORDER — HYDROXYZINE HCL 50 MG PO TABS
50.0000 mg | ORAL_TABLET | Freq: Three times a day (TID) | ORAL | Status: DC | PRN
  Administered 2013-01-12: 50 mg via ORAL
  Filled 2013-01-12: qty 21
  Filled 2013-01-12: qty 1

## 2013-01-12 MED ORDER — CLONAZEPAM 0.5 MG PO TABS
0.5000 mg | ORAL_TABLET | Freq: Two times a day (BID) | ORAL | Status: DC
  Administered 2013-01-12 – 2013-01-13 (×2): 0.5 mg via ORAL
  Filled 2013-01-12 (×2): qty 1

## 2013-01-12 NOTE — Discharge Summary (Signed)
Physician Discharge Summary Note  Patient:  Alyssa Prince is an 42 y.o., female MRN:  [id number] DOB:  1970-08-17 Patient phone:  [phone] (home)  Patient address:   [address] [place] [address],   Date of Admission:  01/09/2013 Date of Discharge: 01/13/2013  Reason for Admission:  Bipolar disorder with suicidal ideations  Discharge Diagnoses: Active Problems:   * No active hospital problems. *  ROS  DSM5: DSM5:  Schizophrenia Disorders:  Obsessive-Compulsive Disorders:  Trauma-Stressor Disorders:  Substance/Addictive Disorders:  Depressive Disorders: Major Depressive Disorder - with Psychotic Features (296.24)  AXIS I: Bipolar, mixed  AXIS II: Deferred  AXIS III:  Past Medical History   Diagnosis  Date   .  Hypertension    .  Active smoker    .  Hx of hernia repair    .  Bipolar 1 disorder    .  Depression     AXIS IV: economic problems, housing problems, other psychosocial or environmental problems, problems related to social environment and problems with primary support group  AXIS V: 41-50 serious symptoms  Level of Care:  OP  Hospital Course:  [name] was admitted voluntarily from the [facility] where she presented with mixed symptoms of bipolar disorder with suicidal ideation. She also reported auditory hallucinations, was disorganized with tangential thoughts and significant deficits in functioning.        [name] was felt to be in need of acute psychiatric hospitalization for stabilization, crisis management, and medication management. She reported that her outpatient provider had discontinued her Lithium, which she noted is the only drug that keeps her stable enough to function.        [name] was oriented to the unit and encouraged to participate in unit programming. Medical problems were identified and treated appropriately. Home medication was restarted as needed. Psychiatric medication management was initiated.        The patient was  evaluated each day by a clinical provider to ascertain the patient's response to treatment.  Improvement was noted by the patient's report of decreasing symptoms, improved sleep and appetite, affect, medication tolerance, behavior, and participation in unit programming.  [name] was asked each day to complete a self inventory noting mood, mental status, pain, new symptoms, anxiety and concerns.         She responded well to medication and being in a therapeutic and supportive environment. Positive and appropriate behavior was noted and the patient was motivated for recovery. [name] worked closely with the treatment team and case manager to develop a discharge plan with appropriate goals. Coping skills, problem solving as well as relaxation therapies were also part of the unit programming.         By the day of discharge the patient was in much improved condition than upon admission.  Symptoms were reported as significantly decreased or resolved completely.  The patient denied SI/HI and voiced no AVH. She was motivated to continue taking medication with a goal of continued improvement in mental health.  She was discharged home with a plan to follow up as noted below. Consults:  None  Significant Diagnostic Studies:  labs: CBC, CMP, UA, UDS, UPT, Lithium Level  Discharge Vitals:   Blood pressure 119/66, pulse 68, temperature 97.9 F (36.6 C), temperature source Oral, resp. rate 20, height 4' 11.65" (1.515 m), weight 118.842 kg (262 lb). Body mass index is 51.78 kg/(m^2). Lab Results:   No results found for this or any previous visit (from the  past 72 hour(s)).  Physical Findings: AIMS: Facial and Oral Movements Muscles of Facial Expression: None, normal Lips and Perioral Area: None, normal Jaw: None, normal Tongue: None, normal,Extremity Movements Upper (arms, wrists, hands, fingers): None, normal Lower (legs, knees, ankles, toes): None, normal, Trunk Movements Neck, shoulders, hips: None, normal,  Overall Severity Severity of abnormal movements (highest score from questions above): None, normal Incapacitation due to abnormal movements: None, normal Patient's awareness of abnormal movements (rate only patient's report): No Awareness, Dental Status Current problems with teeth and/or dentures?: No Does patient usually wear dentures?: No  CIWA:    COWS:     Psychiatric Specialty Exam: See Psychiatric Specialty Exam and Suicide Risk Assessment completed by Attending Physician prior to discharge.  Discharge destination:  Home  Is patient on multiple antipsychotic therapies at discharge:  No   Has Patient had three or more failed trials of antipsychotic monotherapy by history:  No  Recommended Plan for Multiple Antipsychotic Therapies: NA  Discharge Orders   Future Orders Complete By Expires   Diet - low sodium heart healthy  As directed    Discharge instructions  As directed    Comments:     Take all of your medications as directed. Be sure to keep all of your follow up appointments.  If you are unable to keep your follow up appointment, call your Doctor's office to let them know, and reschedule.  Make sure that you have enough medication to last until your appointment. Be sure to get plenty of rest. Going to bed at the same time each night will help. Try to avoid sleeping during the day.  Increase your activity as tolerated. Regular exercise will help you to sleep better and improve your mental health. Eating a heart healthy diet is recommended. Try to avoid salty or fried foods. Be sure to avoid all alcohol and illegal drugs.   Increase activity slowly  As directed        Medication List    ASK your doctor about these medications     Indication   albuterol 108 (90 BASE) MCG/ACT inhaler  Commonly known as:  PROVENTIL HFA;VENTOLIN HFA  Inhale 2 puffs into the lungs every 6 (six) hours as needed for wheezing or shortness of breath.     Wheezing or shortness of breath.    hydrochlorothiazide 12.5 MG capsule  Commonly known as:  MICROZIDE  Take 12.5 mg by mouth daily.     For Edema   hydrOXYzine 25 MG capsule  Commonly known as:  VISTARIL  Take 25 mg by mouth 3 (three) times daily as needed for anxiety. For anxiety      For anxiety   lisinopril 5 MG tablet  Commonly known as:  PRINIVIL,ZESTRIL  Take 1 tablet (5 mg total) by mouth every other day.     For hypertension   lithium carbonate 450 MG CR tablet  Commonly known as:  ESKALITH  Take 450 mg by mouth 2 (two) times daily.    Mood stabilization   topiramate 50 MG tablet  Commonly known as:  TOPAMAX  Take 50 mg by mouth 2 (two) times daily.   Indication:  Antipsychotic Therapy-Induced Weight Gain       Follow-up Information   Follow up with [facility] On 01/14/2013. (Please go to [facility]'s walk in clinic On Friday, January 14, 2013 or any weekday between 8AM - 3PM)    Contact information:   [address] [place], [place]   [address]  [phone]      Follow-up recommendations:    Comments:  To out patient provider, it is recommended that Arrington stay on these medications especially the Lithium.   Total Discharge Time:  Greater than 30 minutes.  Signed: Rona Ravens. M[place]burn RPAC 9:56 AM 01/13/2013  Patient is seen and evaluated personally for suicidalrisk assessment and case discussed with physician extender and developed discharge treatment plan. Reviewed the information documented and agree with the treatment plan.  Burwell Bethel,JANARDHAHA R. 01/15/2013 3:21 PM

## 2013-01-12 NOTE — Progress Notes (Signed)
Patient ID: Alyssa Prince, female   DOB: [birth year], 42 y.o.   MRN: [id number] [facility] MD Progress Note  01/12/2013 2:33 PM Majorie Santee  MRN:  [id number]  Subjective:  Patient complaints of feeling anxious and paranoid but not able to give more details. she stated that somebody on the unit is not treating her but says at the same time she can't prove it. she wants to go out and charge her employer regarding taking away from her bank account. She has been restless and frequently coming to the office requesting to be discharged even after giving her disposition plan. She continues to be moody, frustrated for unknown reasons, disturbance of sleep but compliant with her medication. She also reported her psychiatrist from [facility] asked her to stop her lithium and she stopped it before she become more disorganized and manic at this time. Will ask case manager to contact her psychiatric office to provide our recommendations at the time of discharge.   Diagnosis:   DSM5: Schizophrenia Disorders:   Obsessive-Compulsive Disorders:   Trauma-Stressor Disorders:   Substance/Addictive Disorders:   Depressive Disorders:    Axis I: Bipolar, Manic  ADL's:  Impaired  Sleep: Poor  Appetite:  Poor  Suicidal Ideation:  Patient is unable to provide the information due to disorganized and severe thought blocking Homicidal Ideation:  Patient is unable to provide the information due to disorganized and severe thought blocking AEB (as evidenced by):  Psychiatric Specialty Exam: ROS  Blood pressure 109/72, pulse 82, temperature 97.6 F (36.4 C), temperature source Oral, resp. rate 16, height 4' 11.65" (1.515 m), weight 118.842 kg (262 lb).Body mass index is 51.78 kg/(m^2).  General Appearance: Bizarre, Disheveled and Guarded  Eye Contact::  Minimal  Speech:  Blocked and Slow  Volume:  Decreased  Mood:  Anxious, Euphoric and Irritable  Affect:  Non-Congruent and Labile  Thought Process:  Disorganized,  Irrelevant, Loose and Tangential  Orientation:  Full (Time, Place, and Person)  Thought Content:  Paranoid Ideation  Suicidal Thoughts:  No  Homicidal Thoughts:  No  Memory:  Immediate;   Fair  Judgement:  Impaired  Insight:  Lacking  Psychomotor Activity:  Psychomotor Retardation and Restlessness  Concentration:  Fair  Recall:  Fair  Akathisia:  NA  Handed:  Right  AIMS (if indicated):     Assets:  Communication Skills Desire for Improvement Physical Health Resilience Social Support  Sleep:  Number of Hours: 4.5   Current Medications: Current Facility-Administered Medications  Medication Dose Route Frequency Provider Last Rate Last Dose  . acetaminophen (TYLENOL) tablet 650 mg  650 mg Oral Q4H PRN Court Joy, PA-C      . albuterol (PROVENTIL HFA;VENTOLIN HFA) 108 (90 BASE) MCG/ACT inhaler 2 puff  2 puff Inhalation Q6H PRN Court Joy, PA-C      . alum & mag hydroxide-simeth (MAALOX/MYLANTA) 200-200-20 MG/5ML suspension 30 mL  30 mL Oral PRN Court Joy, PA-C   30 mL at 01/11/13 2000  . clonazePAM (KLONOPIN) tablet 0.5 mg  0.5 mg Oral BID Nehemiah Settle, MD      . hydrochlorothiazide (MICROZIDE) capsule 12.5 mg  12.5 mg Oral Daily Court Joy, PA-C   12.5 mg at 01/12/13 0732  . hydrOXYzine (ATARAX/VISTARIL) tablet 50 mg  50 mg Oral TID PRN Nehemiah Settle, MD      . lisinopril (PRINIVIL,ZESTRIL) tablet 5 mg  5 mg Oral Daily Court Joy, PA-C   5 mg at 01/12/13  0800  . lithium carbonate (ESKALITH) CR tablet 450 mg  450 mg Oral Q12H Court Joy, PA-C   450 mg at 01/12/13 0732  . magnesium hydroxide (MILK OF MAGNESIA) suspension 30 mL  30 mL Oral Daily PRN Court Joy, PA-C      . nicotine polacrilex (NICORETTE) gum 2 mg  2 mg Oral PRN Nehemiah Settle, MD   2 mg at 01/12/13 1610  . ondansetron (ZOFRAN) tablet 4 mg  4 mg Oral Q8H PRN Court Joy, PA-C      . topiramate (TOPAMAX) tablet 50 mg  50 mg Oral BID Court Joy, PA-C   50 mg at 01/12/13 0732  . traZODone (DESYREL) tablet 50 mg  50 mg Oral QHS PRN Court Joy, PA-C   50 mg at 01/11/13 2234    Lab Results: No results found for this or any previous visit (from the past 48 hour(s)).  Physical Findings: AIMS: Facial and Oral Movements Muscles of Facial Expression: None, normal Lips and Perioral Area: None, normal Jaw: None, normal Tongue: None, normal,Extremity Movements Upper (arms, wrists, hands, fingers): None, normal Lower (legs, knees, ankles, toes): None, normal, Trunk Movements Neck, shoulders, hips: None, normal, Overall Severity Severity of abnormal movements (highest score from questions above): None, normal Incapacitation due to abnormal movements: None, normal Patient's awareness of abnormal movements (rate only patient's report): No Awareness, Dental Status Current problems with teeth and/or dentures?: No Does patient usually wear dentures?: No  CIWA:    COWS:     Treatment Plan Summary: Daily contact with patient to assess and evaluate symptoms and progress in treatment Medication management  Plan: Continue Lithium and Topamax and will monitor for adverse effects Monitor lithium level and CBC with differential labs order for tomorrow morning Increase Vistaril 50 mg TID/PRN Start Klonopin 0.5 mg PO BID  Treatment Plan/Recommendations:  1. Admit for crisis management and stabilization. 2. Medication management to reduce current symptoms to base line and improve the patient's overall level of functioning. 3. Treat health problems as indicated. 4. Develop treatment plan to decrease risk of relapse upon discharge and to reduce the need for readmission. 5. Psycho-social education regarding relapse prevention and self care. 6. Health care follow up as needed for medical problems. 7. Disposition plans in progress, may discharge tomorrow if her labs are therapeutic.   Medical Decision Making Problem Points:  Established  problem, worsening (2), Review of last therapy session (1) and Review of psycho-social stressors (1) Data Points:  Review or order clinical lab tests (1) Review or order medicine tests (1) Review of medication regiment & side effects (2) Review of new medications or change in dosage (2)  I certify that inpatient services furnished can reasonably be expected to improve the patient's condition.   Makhari Dovidio,JANARDHAHA R. 01/12/2013, 2:33 PM

## 2013-01-12 NOTE — Progress Notes (Signed)
Adult Psychoeducational Group Note  Date:  01/12/2013 Time: 10:00 11:00am Group Topic/Focus:  Therapeutic Activity and Personal Development Groups  Participation Level:  Did Not Attend  Participation Quality:    Affect:    Cognitive:   Insight:   Engagement in Group:    Modes of Intervention:    Additional Comments:  Pt did not attend group  Shelly Bombard D 01/12/2013, 12:04 PM

## 2013-01-12 NOTE — Progress Notes (Signed)
Recreation Therapy Notes  Date: 09.17.2014 Time: 3:00pm Location: [address] Dayroom  Group Topic: Communication, Team Building, Problem Solving  Goal Area(s) Addresses:  Patient will effectively work with peer towards shared goal.  Patient will identify skill used to make activity successful.  Patient will identify how skills used during activity can be used to reach post d/c goals.   Behavioral Response: Observation  Intervention: Problem Solving Task  Activity: [employer]. In small groups (3-4 patients) patients were given the following supplies and were asked to work together to build a launching mechanism to launch a ping pong ball 12 feet. Supplies: 5 drinking straws, 5 rubber bands, 5 paper clips, 2 index cards, 2 paper cups, and 2 toilet paper rolls.    Education: Customer service manager, Discharge Planning   Education Outcome: Needs additional education  Clinical Observations/Feedback: Patient arrived to group session at approximately 3:25pm. Upon arriving patient observed group members launch ping pong ball and listened to wrap up discussion. Patient made no contributions to wrap up discission.    Marykay Lex Jamina Macbeth, LRT/CTRS  Dore Oquin L 01/12/2013 4:40 PM

## 2013-01-12 NOTE — [facility] Group Notes (Signed)
[facility] LCSW Group Therapy  Emotional Regulation 1:15 - 2: 30 PM        01/12/2013 2:35 PM'   Type of Therapy:  Group Therapy  Participation Level:  Minimal  Participation Quality:  Minimal  Affect:  Appropriate  Cognitive:  Attentive Appropriate  Insight:  Developing/Improving  Engagement in Therapy:  Developing/Improving  Modes of Intervention:  Discussion Exploration Problem-Solving Supportive  Summary of Progress/Problems:  Group topic was emotional regulations.  Patient listened attentively but did not engage in discussion. Wynn Banker 01/12/2013 2:35 PM

## 2013-01-12 NOTE — Progress Notes (Signed)
D: Patient denies SI/HI and auditory and visual hallucinations. The patient has an anxious mood and affect. The patient states that she has a "fear of the unknown" and that she is working on how to resolve this issue. The patient states that she plans to take her medications as scheduled when discharging to prevent relapse. The patient is attending groups and interacting appropriately within the milieu.  A: Patient given emotional support from RN. Patient encouraged to come to staff with concerns and/or questions. Patient's medication routine continued. Patient's orders and plan of care reviewed.  R: Patient remains appropriate and cooperative. Will continue to monitor patient q15 minutes for safety.

## 2013-01-12 NOTE — [facility] Group Notes (Signed)
[facility] LCSW Aftercare Discharge Planning Group Note   01/12/2013 10:00 AM    Participation Quality:  Appropraite  Mood/Affect:  Appropriate  Depression Rating:  2  Anxiety Rating:  2  Thoughts of Suicide:  No  Will you contract for safety?   NA  Current AVH:  No  Plan for Discharge/Comments:  Patient attending discharge planning group and actively participated in group.  Patient reports doing much better and looks forward to discharging home today.  She will follow up with [facility] at discharge.  CSW provided all participants with daily workbook and information on services offered by [facility].   Transportation Means: Patient has transportation.   Supports:  Patient has a support system.   Aimee Heldman, [name]

## 2013-01-12 NOTE — Progress Notes (Signed)
Adult Psychoeducational Group Note  Date:  01/12/2013 Time:  9:51 PM  Group Topic/Focus:  Wrap-Up Group:   The focus of this group is to help patients review their daily goal of treatment and discuss progress on daily workbooks.  Participation Level:  Minimal  Participation Quality:  Appropriate and Drowsy  Affect:  Appropriate  Cognitive:  Appropriate  Insight: Appropriate  Engagement in Group:  Developing/Improving and Improving  Modes of Intervention:  Discussion  Additional Comments:  Pt was falling asleep during group. She stated that the one good thing that happened was that she was able to wake up this morning and so was her kids. She seemed hesitant to tlk, as staff questioned why her disposition was such. She stated that she felt as though [place] was not the place for her and that it was driving her to have her break downs because she felt as though it was too slow and behind on the times. Discussion was held and pt was encouraged to go visit other places in [place] that are of a more city environment. Pt agreed that she would be willing to give it a try.   Shanena Pellegrino 01/12/2013, 9:51 PM

## 2013-01-12 NOTE — Progress Notes (Addendum)
   D:  Pt endorses AH- telling her positive things. Pt denies SI/HI/VH. Pt is pleasant and cooperative. Pt states she has been having real big anxiety and it seems to be getting worse due to the fact that she is fearful of the unknown and what her life will bring or what will happen to her when she leaves here..   A: Pt was offered support and encouragement. Pt was given scheduled medications. Pt was encourage to attend groups. Q 15 minute checks were done for safety.   R:Pt attends groups and interacts well with peers and staff. Pt is taking medication. Pt has no complaints.Pt receptive to treatment and safety maintained on unit.

## 2013-01-12 NOTE — Tx Team (Signed)
Interdisciplinary Treatment Plan Update   Date Reviewed:  01/12/2013  Time Reviewed:  9:39 AM  Progress in Treatment:   Attending groups: Yes Participating in groups: Yes Taking medication as prescribed: Yes  Tolerating medication: Yes Family/Significant other contact made: Yes, contact made with fahter Patient understands diagnosis: Yes  Discussing patient identified problems/goals with staff: Yes Medical problems stabilized or resolved: Yes Denies suicidal/homicidal ideation: Yes Patient has not harmed self or others: Yes  For review of initial/current patient goals, please see plan of care.  Estimated Length of Stay:  1-2 days  Reasons for Continued Hospitalization:  Anxiety Depression Medication stabilization   New Problems/Goals identified:    Discharge Plan or Barriers:   Home with outpatient follow up with [facility]  Additional Comments:  Attendees:  Patient:  01/12/2013 9:39 AM   Signature: Mervyn Gay, MD 01/12/2013 9:39 AM  Signature:  Verne Spurr, PA 01/12/2013 9:39 AM  Signature:  Gerrianne Scale 01/12/2013 9:39 AM  Signature:  Onnie Boer, RN, UR-CM 01/12/2013 9:39 AM  Signature:   01/12/2013 9:39 AM  Signature:  Juline Patch, LCSW 01/12/2013 9:39 AM  Signature:  [name], LCSW 01/12/2013 9:39 AM  Signature:  Maseta Dorley,Care Coordinator 01/12/2013 9:39 AM  Signature:   01/12/2013 9:39 AM  Signature: Leighton Parody, RN 01/12/2013  9:39 AM  Signature:    Signature:      Scribe for Treatment Team:   Juline Patch,  01/12/2013 9:39 AM

## 2013-01-13 MED ORDER — LITHIUM CARBONATE ER 450 MG PO TBCR: EXTENDED_RELEASE_TABLET | ORAL | Status: DC

## 2013-01-13 MED ORDER — LISINOPRIL 5 MG PO TABS: 5.0000 mg | ORAL_TABLET | ORAL | Status: DC

## 2013-01-13 MED ORDER — HYDROCHLOROTHIAZIDE 12.5 MG PO CAPS: 12.5000 mg | ORAL_CAPSULE | Freq: Every day | ORAL | Status: DC

## 2013-01-13 MED ORDER — ALBUTEROL SULFATE HFA 108 (90 BASE) MCG/ACT IN AERS: 2.0000 | INHALATION_SPRAY | Freq: Four times a day (QID) | RESPIRATORY_TRACT | Status: AC | PRN

## 2013-01-13 MED ORDER — TRAZODONE HCL 50 MG PO TABS: ORAL_TABLET | ORAL | Status: DC

## 2013-01-13 MED ORDER — HYDROXYZINE HCL 50 MG PO TABS: 50.0000 mg | ORAL_TABLET | Freq: Three times a day (TID) | ORAL | Status: DC | PRN

## 2013-01-13 MED ORDER — TOPIRAMATE 50 MG PO TABS: 50.0000 mg | ORAL_TABLET | Freq: Two times a day (BID) | ORAL | Status: DC

## 2013-01-13 MED ORDER — CLONAZEPAM 0.5 MG PO TABS: 0.5000 mg | ORAL_TABLET | Freq: Two times a day (BID) | ORAL | Status: DC

## 2013-01-13 MED ORDER — ALBUTEROL SULFATE HFA 108 (90 BASE) MCG/ACT IN AERS: 2.0000 | INHALATION_SPRAY | Freq: Four times a day (QID) | RESPIRATORY_TRACT | Status: DC | PRN

## 2013-01-13 NOTE — Progress Notes (Signed)
Pt discharged per MD orders; pt currently denies SI/HI and auditory/visual hallucinations; pt was given education by RN regarding follow-up appointments and medications and pt denied any questions or concerns about these instructions; pt was then escorted to search room to retrieve her belongings by RN before being discharged to hospital lobby. 

## 2013-01-13 NOTE — Progress Notes (Addendum)
Chaplain provided support with pt around discharge plans, feeling of anxiety.  Pt shared vaguely with chaplain about move to [place] from [place] and frustration with neighborhood.  Described wanting to move her son someplace more safe.  Described feeling lonely due to being isolated from neighbors because she "chooses to do the right thing."   Pt moved toward tearfulness several times during encounter, but remained vague on subjects that were emotionally engaging and stated "I'm going to be ok"  Belva Crome MDiv

## 2013-01-13 NOTE — Progress Notes (Signed)
Patient ID: Alyssa Prince, female   DOB: 06-14-1970, 42 y.o.   MRN: [id number] D: Patient in room on approach. Pt mood/affect is anxious. Pt stated she is ready to be discharged but anxious of where she will stay. Pt stated using coping skills learned here to help her get her life together. Pt denies SI/HI/AVH and pain. Pt attended evening wrap up group and engaged in discussions. Pt denies any needs or concerns. Cooperative with assessment. No acute distressed noted at this time.   A: Met with pt 1:1. Medications administered as prescribed. Writer encouraged pt to discuss feelings. Pt encouraged to come to staff with any question or concerns. 15 minutes checks for safety.  R: Patient remains safe. She is complaint with medications and denies any adverse reaction. Continue current POC.

## 2013-01-13 NOTE — Progress Notes (Signed)
Adult Psychoeducational Group Note  Date:  01/13/2013 Time:  3:00 PM  Group Topic/Focus:  Overcoming Stress:   The focus of this group is to define stress and help patients assess their triggers.  Participation Level:  Minimal  Participation Quality:  Drowsy  Affect:  Lethargic  Cognitive:  Lacking  Insight: Limited  Engagement in Group:  Poor  Modes of Intervention:  Discussion, Education, Socialization and Support  Additional Comments:  Pt slept through most of the group. Pt stated that her biggest stressor is her health and her lack of a living situation. Pt stated that she wanted to try walking to help relieve her stress.  Alyssa Prince 01/13/2013, 3:00 PM

## 2013-01-13 NOTE — [facility] Suicide Risk Assessment (Signed)
Suicide Risk Assessment  Discharge Assessment     Demographic Factors:  Adolescent or young adult, Low socioeconomic status and Living alone  Mental Status Per Nursing Assessment::   On Admission:     Current Mental Status by Physician: NA  Loss Factors: Financial problems/change in socioeconomic status  Historical Factors: NA  Risk Reduction Factors:   Sense of responsibility to family, Religious beliefs about death, Employed, Positive social support, Positive therapeutic relationship and Positive coping skills or problem solving skills  Continued Clinical Symptoms:  Bipolar Disorder:   Mixed State  Cognitive Features That Contribute To Risk:  Polarized thinking    Suicide Risk:  Minimal: No identifiable suicidal ideation.  Patients presenting with no risk factors but with morbid ruminations; may be classified as minimal risk based on the severity of the depressive symptoms  Discharge Diagnoses:   AXIS I:  Bipolar, mixed AXIS II:  Deferred AXIS III:   Past Medical History  Diagnosis Date  . Hypertension   . Active smoker   . Hx of hernia repair   . Bipolar 1 disorder   . Depression    AXIS IV:  occupational problems, other psychosocial or environmental problems and problems related to social environment AXIS V:  51-60 moderate symptoms  Plan Of Care/Follow-up recommendations:  Activity:  Asd tolerated Diet:  Regular  Is patient on multiple antipsychotic therapies at discharge:  No   Has Patient had three or more failed trials of antipsychotic monotherapy by history:  No  Recommended Plan for Multiple Antipsychotic Therapies: NA  Meggin Ola,JANARDHAHA R. 01/13/2013, 9:52 AM

## 2013-01-13 NOTE — Progress Notes (Signed)
[facility] Adult Case Management Discharge Plan :  Will you be returning to the same living situation after discharge: Yes, patient is returning to her home. At discharge, do you have transportation home?:Yes,  Patient able to arrange transportation Do you have the ability to pay for your medications:Yes,  Patient has Medicaid  Release of information consent forms completed and in the chart;  Patient's signature needed at discharge.  Patient to Follow up at: Follow-up Information   Follow up with Dr. Creta Levin - [facility] On 01/20/2013. (Thursday, January 20, 2013 at 1:30 PM)    Contact information:   [address] [place], [place]   [address]  [phone]      Patient denies SI/HI:   Patient no longer endorsing SI/HI or other thoughts of self harm.     Safety Planning and Suicide Prevention discussed: .Reviewed with all patients during discharge planning group    Alyssa Prince 01/13/2013, 10:30 AM

## 2013-01-18 NOTE — Progress Notes (Signed)
Patient Discharge Instructions:  After Visit Summary (AVS):   Faxed to:  01/18/13 Discharge Summary Note:   Faxed to:  01/18/13 Psychiatric Admission Assessment Note:   Faxed to:  01/18/13 Suicide Risk Assessment - Discharge Assessment:   Faxed to:  01/18/13 Faxed/Sent to the Next Level Care provider:  01/18/13 Faxed to [facility] @ [phone]  Jerelene Redden, 01/18/2013, 3:00 PM

## 2013-01-24 ENCOUNTER — Emergency Department (HOSPITAL_COMMUNITY)
Admission: EM | Admit: 2013-01-24 | Discharge: 2013-01-24 | Disposition: A | Discharge status: Home / Self Care | Payer: Medicaid Other | Attending: Emergency Medicine | Admitting: Emergency Medicine

## 2013-01-24 ENCOUNTER — Encounter (HOSPITAL_COMMUNITY): Payer: Self-pay | Admitting: Emergency Medicine

## 2013-01-24 DIAGNOSIS — IMO0002 Reserved for concepts with insufficient information to code with codable children: Secondary | ICD-10-CM | POA: Insufficient documentation

## 2013-01-24 DIAGNOSIS — I10 Essential (primary) hypertension: Secondary | ICD-10-CM | POA: Insufficient documentation

## 2013-01-24 DIAGNOSIS — Z760 Encounter for issue of repeat prescription: Secondary | ICD-10-CM

## 2013-01-24 DIAGNOSIS — F17200 Nicotine dependence, unspecified, uncomplicated: Secondary | ICD-10-CM | POA: Insufficient documentation

## 2013-01-24 DIAGNOSIS — F411 Generalized anxiety disorder: Secondary | ICD-10-CM | POA: Insufficient documentation

## 2013-01-24 DIAGNOSIS — Z9889 Other specified postprocedural states: Secondary | ICD-10-CM | POA: Insufficient documentation

## 2013-01-24 DIAGNOSIS — F319 Bipolar disorder, unspecified: Secondary | ICD-10-CM | POA: Insufficient documentation

## 2013-01-24 DIAGNOSIS — Z79899 Other long term (current) drug therapy: Secondary | ICD-10-CM | POA: Insufficient documentation

## 2013-01-24 MED ORDER — CLONAZEPAM 0.5 MG PO TABS: 0.5000 mg | ORAL_TABLET | Freq: Two times a day (BID) | ORAL | Status: DC | PRN
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
## 2013-01-27 ENCOUNTER — Emergency Department (HOSPITAL_COMMUNITY): Payer: Medicaid Other

## 2013-01-27 ENCOUNTER — Emergency Department (HOSPITAL_COMMUNITY)
Admission: EM | Admit: 2013-01-27 | Discharge: 2013-01-28 | Disposition: A | Discharge status: Home / Self Care | Payer: Medicaid Other | Attending: Emergency Medicine | Admitting: Emergency Medicine

## 2013-01-27 ENCOUNTER — Encounter (HOSPITAL_COMMUNITY): Payer: Self-pay | Admitting: Adult Health

## 2013-01-27 DIAGNOSIS — Z9889 Other specified postprocedural states: Secondary | ICD-10-CM | POA: Insufficient documentation

## 2013-01-27 DIAGNOSIS — I10 Essential (primary) hypertension: Secondary | ICD-10-CM | POA: Insufficient documentation

## 2013-01-27 DIAGNOSIS — N390 Urinary tract infection, site not specified: Secondary | ICD-10-CM | POA: Insufficient documentation

## 2013-01-27 DIAGNOSIS — A599 Trichomoniasis, unspecified: Secondary | ICD-10-CM | POA: Insufficient documentation

## 2013-01-27 DIAGNOSIS — R51 Headache: Secondary | ICD-10-CM | POA: Insufficient documentation

## 2013-01-27 DIAGNOSIS — F17200 Nicotine dependence, unspecified, uncomplicated: Secondary | ICD-10-CM | POA: Insufficient documentation

## 2013-01-27 DIAGNOSIS — Z79899 Other long term (current) drug therapy: Secondary | ICD-10-CM | POA: Insufficient documentation

## 2013-01-27 DIAGNOSIS — F3130 Bipolar disorder, current episode depressed, mild or moderate severity, unspecified: Secondary | ICD-10-CM | POA: Insufficient documentation

## 2013-01-27 DIAGNOSIS — R9431 Abnormal electrocardiogram [ECG] [EKG]: Secondary | ICD-10-CM | POA: Insufficient documentation

## 2013-01-27 DIAGNOSIS — Z3202 Encounter for pregnancy test, result negative: Secondary | ICD-10-CM | POA: Insufficient documentation

## 2013-01-27 LAB — BASIC METABOLIC PANEL
BUN: 10 mg/dL (ref 6–23)
Calcium: 9.3 mg/dL (ref 8.4–10.5)
Creatinine, Ser: 1.09 mg/dL (ref 0.50–1.10)
GFR calc Af Amer: 73 mL/min — ABNORMAL LOW (ref >90)
GFR calc non Af Amer: 63 mL/min — ABNORMAL LOW (ref >90)
Potassium: 3.3 mEq/L — ABNORMAL LOW (ref 3.5–5.1)

## 2013-01-27 LAB — URINALYSIS, ROUTINE W REFLEX MICROSCOPIC
Bilirubin Urine: NEGATIVE
Ketones, ur: NEGATIVE mg/dL
Nitrite: NEGATIVE
Protein, ur: NEGATIVE mg/dL
Urobilinogen, UA: 0.2 mg/dL (ref 0.0–1.0)

## 2013-01-27 LAB — CBC WITH DIFFERENTIAL/PLATELET
Basophils Relative: 0 % (ref 0–1)
Eosinophils Absolute: 0.3 10*3/uL (ref 0.0–0.7)
Hemoglobin: 12.4 g/dL (ref 12.0–15.0)
MCH: 27.1 pg (ref 26.0–34.0)
MCHC: 33.7 g/dL (ref 30.0–36.0)
Monocytes Absolute: 1.2 10*3/uL — ABNORMAL HIGH (ref 0.1–1.0)
Monocytes Relative: 6 % (ref 3–12)
Neutrophils Relative %: 64 % (ref 43–77)

## 2013-01-27 LAB — ETHANOL: Alcohol, Ethyl (B): <11 mg/dL (ref 0–11)

## 2013-01-27 LAB — RAPID URINE DRUG SCREEN, HOSP PERFORMED
Cocaine: NOT DETECTED
Opiates: NOT DETECTED
Tetrahydrocannabinol: NOT DETECTED

## 2013-01-27 LAB — LITHIUM LEVEL: Lithium Lvl: 0.28 mEq/L — ABNORMAL LOW (ref 0.80–1.40)

## 2013-01-27 MED ORDER — METRONIDAZOLE 500 MG PO TABS
2000.0000 mg | ORAL_TABLET | Freq: Once | ORAL | Status: AC
  Administered 2013-01-27: 2000 mg via ORAL
  Filled 2013-01-27: qty 4

## 2013-01-27 MED ORDER — CEPHALEXIN 250 MG PO CAPS
500.0000 mg | ORAL_CAPSULE | Freq: Once | ORAL | Status: AC
  Administered 2013-01-27: 500 mg via ORAL
  Filled 2013-01-27: qty 2

## 2013-01-27 NOTE — ED Notes (Signed)
I spoke with patient and asked her if she would go to CT to have her test done and she refuses.  She advised that she wants her discharge papers to go home to her child.  I advised N. Pisciotta, PA, and Mendel Corning, Environmental education officer.
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
## 2013-01-27 NOTE — ED Provider Notes (Signed)
CSN: [id number]     Arrival date & time 01/27/13  1738 History   First MD Initiated Contact with Patient 01/27/13 1919     Chief Complaint  Patient presents with  . Dizziness   (Consider location/radiation/quality/duration/timing/severity/associated sxs/prior Treatment) HPI  Alyssa Prince is a 42 y.o. female with PMH significant for HTN, Bipolar, depression c/o dizziness (cannot clarify if vertiginous) mild frontal HA and difficulty concentrating worsening over the last day. States they are painting and hammering in an adjacent apertment and this is irritating her. Pt states she has been compliant with her psych meds. Denies N/V, change in vision, dysarthria, ataxia, CP, SOB, SI, HI, AVH. Poor historian.   Past Medical History  Diagnosis Date  . Hypertension   . Active smoker   . Hx of hernia repair   . Bipolar 1 disorder   . Depression    Past Surgical History  Procedure Laterality Date  . Cholecystectomy    . Hernia repair      4   . Ankle surgery      2 right ankle  . Tonsillectomy    . Tubal ligation     History reviewed. No pertinent family history. History  Substance Use Topics  . Smoking status: Current Every Day Smoker -- 0.50 packs/day    Types: Cigarettes  . Smokeless tobacco: Not on file  . Alcohol Use: Yes     Comment: occasionally   OB History   Grav Para Term Preterm Abortions TAB SAB Ect Mult Living                 Review of Systems 10 systems reviewed and found to be negative, except as noted in the HPI   Allergies  Depakote and Wellbutrin  Home Medications   Current Outpatient Rx  Name  Route  Sig  Dispense  Refill  . albuterol (PROVENTIL HFA;VENTOLIN HFA) 108 (90 BASE) MCG/ACT inhaler   Inhalation   Inhale 2 puffs into the lungs every 6 (six) hours as needed for wheezing or shortness of breath. Related to Asthma.         . clonazePAM (KLONOPIN) 0.5 MG tablet   Oral   Take 0.5 mg by mouth 2 (two) times daily as needed for anxiety.          . hydrochlorothiazide (MICROZIDE) 12.5 MG capsule   Oral   Take 1 capsule (12.5 mg total) by mouth daily. For edema and hypertension.   30 capsule   0   . hydrOXYzine (ATARAX/VISTARIL) 50 MG tablet   Oral   Take 50 mg by mouth 3 (three) times daily as needed for anxiety.         Marland Kitchen lisinopril (PRINIVIL,ZESTRIL) 5 MG tablet   Oral   Take 5 mg by mouth daily.         Marland Kitchen lithium carbonate (ESKALITH) 450 MG CR tablet   Oral   Take 450 mg by mouth 2 (two) times daily.         Marland Kitchen topiramate (TOPAMAX) 50 MG tablet   Oral   Take 50 mg by mouth 2 (two) times daily.         . traZODone (DESYREL) 50 MG tablet   Oral   Take 50 mg by mouth at bedtime as needed for sleep.          BP 122/57  Pulse 101  Temp(Src) 98.9 F (37.2 C) (Oral)  Resp 18  Ht 5' (1.524 m)  Wt 267 lb (121.11  kg)  BMI 52.14 kg/m2  SpO2 99%  LMP 01/05/2013 Physical Exam  Nursing note and vitals reviewed. Constitutional: She is oriented to person, place, and time. She appears well-developed and well-nourished. No distress.  HENT:  Head: Normocephalic.  Mouth/Throat: Oropharynx is clear and moist.  Eyes: Conjunctivae and EOM are normal. Pupils are equal, round, and reactive to light.  Neck: Normal range of motion.  No midline tenderness to palpation or step-offs appreciated. Patient has full range of motion without pain.   Cardiovascular: Normal rate, regular rhythm and intact distal pulses.   Pulmonary/Chest: Effort normal and breath sounds normal. No stridor. No respiratory distress. She has no wheezes. She has no rales. She exhibits no tenderness.  Abdominal: Soft.  Musculoskeletal: Normal range of motion.  Neurological: She is alert and oriented to person, place, and time.  Follows commands, Goal oriented speech, Strength is 5 out of 5x4 extremities, patient ambulates with a coordinated in nonantalgic gait. Sensation is grossly intact.   Skin: Skin is warm. No rash noted.  Psychiatric: Her  affect is labile. Her affect is not blunt and not inappropriate (aggresive). Her speech is not rapid and/or pressured, not delayed, not tangential and not slurred. She is agitated and aggressive. She is not slowed, not withdrawn, not actively hallucinating and not combative. Thought content is not paranoid and not delusional. She does not express inappropriate judgment. She expresses no homicidal and no suicidal ideation. She expresses no suicidal plans and no homicidal plans. She is communicative. She exhibits normal recent memory.  Mood is labile, going from somnolent to aggressive    ED Course  Procedures (including critical care time) Labs Review Labs Reviewed  CBC WITH DIFFERENTIAL - Abnormal; Notable for the following:    WBC 20.4 (*)    Neutro Abs 13.1 (*)    Lymphs Abs 5.8 (*)    Monocytes Absolute 1.2 (*)    All other components within normal limits  BASIC METABOLIC PANEL - Abnormal; Notable for the following:    Potassium 3.3 (*)    GFR calc non Af Amer 63 (*)    GFR calc Af Amer 73 (*)    All other components within normal limits  URINALYSIS, ROUTINE W REFLEX MICROSCOPIC - Abnormal; Notable for the following:    APPearance HAZY (*)    Leukocytes, UA LARGE (*)    All other components within normal limits  URINE MICROSCOPIC-ADD ON - Abnormal; Notable for the following:    Squamous Epithelial / LPF MANY (*)    Bacteria, UA FEW (*)    All other components within normal limits  LITHIUM LEVEL - Abnormal; Notable for the following:    Lithium Lvl 0.28 (*)    All other components within normal limits  URINE CULTURE  ETHANOL  AMMONIA  URINE RAPID DRUG SCREEN (HOSP PERFORMED)  POCT PREGNANCY, URINE   Imaging Review Dg Chest 2 View  01/27/2013   *RADIOLOGY REPORT*  Clinical Data: Dizziness, confused, pain in left eye  CHEST - 2 VIEW  Comparison: 12/03/2012  Findings: The heart size and vascular pattern are normal.  Lungs are clear.  No pleural effusions.  No change from prior  study.  IMPRESSION: No acute findings   Original Report Authenticated By: Esperanza Heir, M.D.    Date: 01/27/2013  Rate: 99  Rhythm: normal sinus rhythm  QRS Axis: normal  Intervals: QT prolonged  ST/T Wave abnormalities: nonspecific ST/T changes  Conduction Disutrbances:none  Narrative Interpretation:   Old EKG Reviewed: unchanged  MDM   1. UTI (lower urinary tract infection)   2. Trichomonas   3. Prolonged Q-T interval on ECG     Filed Vitals:   01/27/13 1927 01/27/13 1937 01/27/13 1938 01/27/13 1940  BP: 108/46 112/41 122/66 122/57  Pulse: 97 92 106 101  Temp: 98.9 F (37.2 C)     TempSrc: Oral     Resp: 18     Height: 5' (1.524 m)     Weight: 267 lb (121.11 kg)     SpO2: 99%        Alyssa Prince is a 42 y.o. female with bipolar poorly able to verbalize her chief complaint. Neuro exam is non-focal and head CT negative. VSS with mild tachycardia. Pt is not floridly psychotic but has labile mood: unclear if this is typical for her baseline. AMS workup performed and negative, except for incidental findings of prolonged QT, sub-therapeutic lithium level and UTI and trich. Pt adamant that she wants to leave the ED. Does not reach IVC criteria. Organized thought and speech pattern and not sign of psychosis. Pt refused single 2g dose of flagyl for trich in the ED. This is a shared visit with the attending physician who personally evaluated the patient and agrees with the care plan.   Medications  cephALEXin (KEFLEX) capsule 500 mg (500 mg Oral Given 01/27/13 2358)  metroNIDAZOLE (FLAGYL) tablet 2,000 mg (2,000 mg Oral Given 01/27/13 2359)    Pt is hemodynamically stable, appropriate for, and amenable to discharge at this time. Pt verbalized understanding and agrees with care plan. All questions answered. Outpatient follow-up and specific return precautions discussed.    Discharge Medication List as of 01/28/2013 12:08 AM    START taking these medications   Details  cephALEXin  (KEFLEX) 500 MG capsule Take 1 capsule (500 mg total) by mouth 2 (two) times daily., Starting 01/28/2013, Until Discontinued, Print    metroNIDAZOLE (FLAGYL) 500 MG tablet Take 1 tablet (500 mg total) by mouth 2 (two) times daily., Starting 01/28/2013, Until Discontinued, Print        Note: Portions of this report may have been transcribed using voice recognition software. Every effort was made to ensure accuracy; however, inadvertent computerized transcription errors may be present      Wynetta Emery, PA-C 01/29/13 0945
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
## 2013-01-27 NOTE — ED Notes (Signed)
Patient was nodding on and off so I asked her if she was taking anything that was making her sleepy and she said no.  She advised only her prescribed medications earlier that day.

## 2013-01-27 NOTE — ED Notes (Signed)
Presents with dizziness that began yesterday, pt states, "they have been painting the apartments I stay at and the fumes have been bothering me and they have been hammering and that has been bothersome and I just can't stay focused and I can't concentrate, it does feel like the room is spinning. It feels like all inside my head is spinning" pt is alert, oriented, MAEx4, dizziness is intermittent. Closing eyes makes dizziness better. No facial droop, no slurred speech, no drift.

## 2013-01-27 NOTE — ED Notes (Signed)
MD at bedside. 

## 2013-01-27 NOTE — ED Provider Notes (Signed)
Medical screening examination/treatment/procedure(s) were performed by non-physician practitioner and as supervising physician I was immediately available for consultation/collaboration.   William Jolicia Delira, MD 01/27/13 0913 

## 2013-01-27 NOTE — ED Notes (Signed)
No answer when called x 2

## 2013-01-28 ENCOUNTER — Encounter (HOSPITAL_COMMUNITY): Payer: Self-pay | Admitting: *Deleted

## 2013-01-28 ENCOUNTER — Emergency Department (HOSPITAL_COMMUNITY)
Admission: EM | Admit: 2013-01-28 | Discharge: 2013-01-28 | Disposition: A | Discharge status: Home / Self Care | Payer: Medicaid Other | Source: Home / Self Care | Attending: Emergency Medicine | Admitting: Emergency Medicine

## 2013-01-28 DIAGNOSIS — F489 Nonpsychotic mental disorder, unspecified: Secondary | ICD-10-CM | POA: Insufficient documentation

## 2013-01-28 DIAGNOSIS — I10 Essential (primary) hypertension: Secondary | ICD-10-CM | POA: Insufficient documentation

## 2013-01-28 DIAGNOSIS — Z888 Allergy status to other drugs, medicaments and biological substances status: Secondary | ICD-10-CM | POA: Insufficient documentation

## 2013-01-28 DIAGNOSIS — Z79899 Other long term (current) drug therapy: Secondary | ICD-10-CM | POA: Insufficient documentation

## 2013-01-28 DIAGNOSIS — Z9889 Other specified postprocedural states: Secondary | ICD-10-CM | POA: Insufficient documentation

## 2013-01-28 DIAGNOSIS — F319 Bipolar disorder, unspecified: Secondary | ICD-10-CM | POA: Insufficient documentation

## 2013-01-28 DIAGNOSIS — F411 Generalized anxiety disorder: Secondary | ICD-10-CM | POA: Insufficient documentation

## 2013-01-28 DIAGNOSIS — F419 Anxiety disorder, unspecified: Secondary | ICD-10-CM

## 2013-01-28 DIAGNOSIS — F17200 Nicotine dependence, unspecified, uncomplicated: Secondary | ICD-10-CM | POA: Insufficient documentation

## 2013-01-28 DIAGNOSIS — R51 Headache: Secondary | ICD-10-CM | POA: Insufficient documentation

## 2013-01-28 LAB — URINE CULTURE: Colony Count: 70000

## 2013-01-28 MED ORDER — CEPHALEXIN 500 MG PO CAPS: 500.0000 mg | ORAL_CAPSULE | Freq: Two times a day (BID) | ORAL | Status: DC

## 2013-01-28 MED ORDER — METRONIDAZOLE 500 MG PO TABS: 500.0000 mg | ORAL_TABLET | Freq: Two times a day (BID) | ORAL | Status: DC

## 2013-01-28 MED ORDER — IBUPROFEN 800 MG PO TABS
800.0000 mg | ORAL_TABLET | Freq: Once | ORAL | Status: AC
  Administered 2013-01-28: 800 mg via ORAL
  Filled 2013-01-28: qty 1

## 2013-01-28 NOTE — ED Notes (Signed)
Patient was alert and oriented, however she was still saying inappropriate things.  She did not have anyone to call so she left on her own.  She was walking appropriately, and said she felt okay.  She said she had to go home to her 40 year old child Rico Sheehan who is with her father.  Patient understood discharge instructions with no questions.

## 2013-01-28 NOTE — ED Notes (Signed)
Pt states that she is feeling overwhelmed and is stressed.  Pt is stating she just needs to rest. And just wants to make sure that she is ok.

## 2013-01-28 NOTE — ED Provider Notes (Signed)
CSN: [id number]     Arrival date & time 01/28/13  0602 History   First MD Initiated Contact with Patient 01/28/13 [phone]     Chief Complaint  Patient presents with  . Headache  . Anxiety   (Consider location/radiation/quality/duration/timing/severity/associated sxs/prior Treatment) The history is provided by the patient and medical records.   Pt presents to the ED for headache and anxiety.  Pt was just seen last night and discharged 6 hours ago.  Pt had an extensive work-up for headache, dizziness, and AMS including lab work, CXR, and CT head.  Pt states she is just concerned and she would like to be seen again by a physician to reassure herself.  States she is somewhat stressed out. She states she is no longer dizzy but still has somewhat of a headache.  No photophobia, aura, visual disturbance, numbness, or weakness.  Pt has been sitting in the waiting room since her discharge earlier this morning despite stating that she needed to be discharged to go pick her daughter.  No new complaints at this time.  VS stable.  Past Medical History  Diagnosis Date  . Hypertension   . Active smoker   . Hx of hernia repair   . Bipolar 1 disorder   . Depression    Past Surgical History  Procedure Laterality Date  . Cholecystectomy    . Hernia repair      4   . Ankle surgery      2 right ankle  . Tonsillectomy    . Tubal ligation     No family history on file. History  Substance Use Topics  . Smoking status: Current Every Day Smoker -- 0.50 packs/day    Types: Cigarettes  . Smokeless tobacco: Not on file  . Alcohol Use: Yes     Comment: occasionally   OB History   Grav Para Term Preterm Abortions TAB SAB Ect Mult Living                 Review of Systems  Neurological: Positive for headaches.  All other systems reviewed and are negative.    Allergies  Depakote and Wellbutrin  Home Medications   Current Outpatient Rx  Name  Route  Sig  Dispense  Refill  . albuterol (PROVENTIL  HFA;VENTOLIN HFA) 108 (90 BASE) MCG/ACT inhaler   Inhalation   Inhale 2 puffs into the lungs every 6 (six) hours as needed for wheezing or shortness of breath. Related to Asthma.         . cephALEXin (KEFLEX) 500 MG capsule   Oral   Take 1 capsule (500 mg total) by mouth 2 (two) times daily.   20 capsule   0   . clonazePAM (KLONOPIN) 0.5 MG tablet   Oral   Take 0.5 mg by mouth 2 (two) times daily as needed for anxiety.         . hydrochlorothiazide (MICROZIDE) 12.5 MG capsule   Oral   Take 1 capsule (12.5 mg total) by mouth daily. For edema and hypertension.   30 capsule   0   . hydrOXYzine (ATARAX/VISTARIL) 50 MG tablet   Oral   Take 50 mg by mouth 3 (three) times daily as needed for anxiety.         Marland Kitchen lisinopril (PRINIVIL,ZESTRIL) 5 MG tablet   Oral   Take 5 mg by mouth daily.         Marland Kitchen lithium carbonate (ESKALITH) 450 MG CR tablet   Oral  Take 450 mg by mouth 2 (two) times daily.         . metroNIDAZOLE (FLAGYL) 500 MG tablet   Oral   Take 1 tablet (500 mg total) by mouth 2 (two) times daily.   14 tablet   0   . topiramate (TOPAMAX) 50 MG tablet   Oral   Take 50 mg by mouth 2 (two) times daily.         . traZODone (DESYREL) 50 MG tablet   Oral   Take 50 mg by mouth at bedtime as needed for sleep.          BP 130/67  Pulse 78  Temp(Src) 98.6 F (37 C) (Oral)  Resp 18  SpO2 100%  LMP 01/05/2013  Physical Exam  Nursing note and vitals reviewed. Constitutional: She is oriented to person, place, and time. She appears well-developed and well-nourished. No distress.  HENT:  Head: Normocephalic and atraumatic.  Mouth/Throat: Oropharynx is clear and moist.  Eyes: Conjunctivae and EOM are normal. Pupils are equal, round, and reactive to light.  Neck: Normal range of motion and full passive range of motion without pain. Neck supple. No rigidity.  No meningeal signs  Cardiovascular: Normal rate, regular rhythm and normal heart sounds.    Pulmonary/Chest: Effort normal and breath sounds normal. No respiratory distress. She has no wheezes.  Musculoskeletal: Normal range of motion.  Neurological: She is alert and oriented to person, place, and time. She has normal strength. She displays no tremor. No cranial nerve deficit or sensory deficit. She displays no seizure activity. Gait normal.  CN grossly intact, moves all extremities appropriately without ataxia, no focal neuro deficits or facial droop appreciated; freely ambulating around the ED with non-ataxic gait  Skin: Skin is warm and dry. She is not diaphoretic.  Psychiatric: She has a normal mood and affect.    ED Course  Procedures (including critical care time)  Labs Review Labs Reviewed - No data to display Imaging Review Dg Chest 2 View  01/27/2013   *RADIOLOGY REPORT*  Clinical Data: Dizziness, confused, pain in left eye  CHEST - 2 VIEW  Comparison: 12/03/2012  Findings: The heart size and vascular pattern are normal.  Lungs are clear.  No pleural effusions.  No change from prior study.  IMPRESSION: No acute findings   Original Report Authenticated By: Esperanza Heir, M.D.   Ct Head Wo Contrast  01/27/2013   CLINICAL DATA:  Severe headache.  EXAM: CT HEAD WITHOUT CONTRAST  TECHNIQUE: Contiguous axial images were obtained from the base of the skull through the vertex without intravenous contrast.  COMPARISON:  None.  FINDINGS: The ventricles are normal. No extra-axial fluid collections are identified. There are few scattered dural calcifications but no CT findings for acute hemispheric infarction an or intracranial hemorrhage. No mass lesions. The brainstem and cerebellum appear normal.  The bony structures are intact. No skull fracture. The paranasal sinuses and mastoid air cells are clear. The globes are intact.  IMPRESSION: No acute intracranial findings or mass lesion.   Electronically Signed   By: Loralie Champagne M.D.   On: 01/27/2013 23:57    MDM   1. Anxiety   2.  Headache    I have reviewed labs and imaging from last night's ED visit-- agree with assessment and plan.  During evaluation pt states "i just needed a minute to gather my thoughts and rest, i'm fine now."  Pt has no neuro deficits on physical exam, freely ambulating around the  ED without difficulty.  I asked numerous times if there was anything else that i could help pt with, she stated no.  Pt given motrin for her headache.  Instructed to take abx from previous visit.  FU with PCP.  Discussed plan with pt, she agreed.  Return precautions advised.  Garlon Hatchet, PA-C 01/28/13 [phone]

## 2013-01-28 NOTE — ED Notes (Signed)
Patient refused to take the last 1000mg  of Flagyl said, "you gave me too much medication".  The patient then talked to N. Pisciotta and she advised her the doses were correct, however she did not need to take it if she did not want it.  She was explained discharge instructions and asked if she had any questions and she said no.  She also refused to have her last BP taken.  Mr. Mathis Fare with security was present during the discharge. I asked if I could call someone for her and she said no that she had driven here.  She said she felt okay to drive home and I could not talk her into calling a cab.

## 2013-01-29 ENCOUNTER — Emergency Department (EMERGENCY_DEPARTMENT_HOSPITAL)
Admission: EM | Admit: 2013-01-29 | Discharge: 2013-01-30 | Disposition: A | Discharge status: Other Acute Inpatient Hospital | Payer: Medicaid Other | Source: Home / Self Care | Attending: Emergency Medicine | Admitting: Emergency Medicine

## 2013-01-29 ENCOUNTER — Encounter (HOSPITAL_COMMUNITY): Payer: Self-pay | Admitting: Emergency Medicine

## 2013-01-29 ENCOUNTER — Other Ambulatory Visit (HOSPITAL_COMMUNITY): Payer: Self-pay | Admitting: Physician Assistant

## 2013-01-29 DIAGNOSIS — F22 Delusional disorders: Secondary | ICD-10-CM

## 2013-01-29 DIAGNOSIS — F3110 Bipolar disorder, current episode manic without psychotic features, unspecified: Secondary | ICD-10-CM

## 2013-01-29 DIAGNOSIS — F17200 Nicotine dependence, unspecified, uncomplicated: Secondary | ICD-10-CM | POA: Diagnosis present

## 2013-01-29 DIAGNOSIS — F315 Bipolar disorder, current episode depressed, severe, with psychotic features: Principal | ICD-10-CM | POA: Diagnosis present

## 2013-01-29 DIAGNOSIS — Z598 Other problems related to housing and economic circumstances: Secondary | ICD-10-CM

## 2013-01-29 DIAGNOSIS — Z79899 Other long term (current) drug therapy: Secondary | ICD-10-CM

## 2013-01-29 DIAGNOSIS — R45851 Suicidal ideations: Secondary | ICD-10-CM

## 2013-01-29 DIAGNOSIS — Z3202 Encounter for pregnancy test, result negative: Secondary | ICD-10-CM | POA: Insufficient documentation

## 2013-01-29 DIAGNOSIS — I10 Essential (primary) hypertension: Secondary | ICD-10-CM | POA: Diagnosis present

## 2013-01-29 DIAGNOSIS — F319 Bipolar disorder, unspecified: Secondary | ICD-10-CM | POA: Insufficient documentation

## 2013-01-29 DIAGNOSIS — Z5987 Material hardship due to limited financial resources, not elsewhere classified: Secondary | ICD-10-CM

## 2013-01-29 DIAGNOSIS — F411 Generalized anxiety disorder: Secondary | ICD-10-CM | POA: Insufficient documentation

## 2013-01-29 HISTORY — DX: Delusional disorders: F22

## 2013-01-29 HISTORY — DX: Unspecified psychosis not due to a substance or known physiological condition: F29

## 2013-01-29 LAB — COMPREHENSIVE METABOLIC PANEL
ALT: 18 U/L (ref 0–35)
Alkaline Phosphatase: 93 U/L (ref 39–117)
BUN: 7 mg/dL (ref 6–23)
CO2: 21 mEq/L (ref 19–32)
Calcium: 9.9 mg/dL (ref 8.4–10.5)
Chloride: 99 mEq/L (ref 96–112)
Creatinine, Ser: 0.84 mg/dL (ref 0.50–1.10)
GFR calc Af Amer: >90 mL/min (ref >90)
GFR calc non Af Amer: 86 mL/min — ABNORMAL LOW (ref >90)
Glucose, Bld: 101 mg/dL — ABNORMAL HIGH (ref 70–99)

## 2013-01-29 LAB — SALICYLATE LEVEL: Salicylate Lvl: <2 mg/dL — ABNORMAL LOW (ref 2.8–20.0)

## 2013-01-29 LAB — RAPID URINE DRUG SCREEN, HOSP PERFORMED: Opiates: NOT DETECTED

## 2013-01-29 LAB — CBC
HCT: 38.9 % (ref 36.0–46.0)
Hemoglobin: 12.9 g/dL (ref 12.0–15.0)
MCH: 26.8 pg (ref 26.0–34.0)
MCV: 80.7 fL (ref 78.0–100.0)
Platelets: 403 10*3/uL — ABNORMAL HIGH (ref 150–400)
RBC: 4.82 MIL/uL (ref 3.87–5.11)

## 2013-01-29 LAB — POCT PREGNANCY, URINE: Preg Test, Ur: NEGATIVE

## 2013-01-29 LAB — ETHANOL: Alcohol, Ethyl (B): <11 mg/dL (ref 0–11)

## 2013-01-29 MED ORDER — NICOTINE 21 MG/24HR TD PT24: 21.0000 mg | MEDICATED_PATCH | Freq: Every day | TRANSDERMAL | Status: DC | PRN

## 2013-01-29 MED ORDER — POTASSIUM CHLORIDE CRYS ER 20 MEQ PO TBCR
40.0000 meq | EXTENDED_RELEASE_TABLET | Freq: Two times a day (BID) | ORAL | Status: DC
  Administered 2013-01-29 – 2013-01-30 (×2): 40 meq via ORAL
  Filled 2013-01-29 (×2): qty 2

## 2013-01-29 MED ORDER — ALUM & MAG HYDROXIDE-SIMETH 200-200-20 MG/5ML PO SUSP: 30.0000 mL | ORAL | Status: DC | PRN

## 2013-01-29 MED ORDER — ONDANSETRON HCL 4 MG PO TABS: 4.0000 mg | ORAL_TABLET | Freq: Three times a day (TID) | ORAL | Status: DC | PRN

## 2013-01-29 MED ORDER — LORAZEPAM 1 MG PO TABS
1.0000 mg | ORAL_TABLET | Freq: Three times a day (TID) | ORAL | Status: DC | PRN
  Administered 2013-01-30: 1 mg via ORAL
  Filled 2013-01-29: qty 1

## 2013-01-29 MED ORDER — HYDROXYZINE HCL 25 MG PO TABS
50.0000 mg | ORAL_TABLET | Freq: Four times a day (QID) | ORAL | Status: DC | PRN
  Administered 2013-01-30 (×2): 50 mg via ORAL
  Filled 2013-01-29 (×2): qty 2

## 2013-01-29 MED ORDER — ACETAMINOPHEN 325 MG PO TABS: 650.0000 mg | ORAL_TABLET | ORAL | Status: DC | PRN

## 2013-01-29 MED ORDER — POTASSIUM CHLORIDE CRYS ER 20 MEQ PO TBCR
40.0000 meq | EXTENDED_RELEASE_TABLET | Freq: Once | ORAL | Status: DC
  Filled 2013-01-29: qty 2

## 2013-01-29 MED ORDER — LORAZEPAM 1 MG PO TABS
2.0000 mg | ORAL_TABLET | Freq: Once | ORAL | Status: AC
  Administered 2013-01-29: 2 mg via ORAL
  Filled 2013-01-29: qty 2

## 2013-01-29 MED ORDER — LITHIUM CARBONATE ER 450 MG PO TBCR
450.0000 mg | EXTENDED_RELEASE_TABLET | Freq: Two times a day (BID) | ORAL | Status: DC
  Administered 2013-01-29 – 2013-01-30 (×2): 450 mg via ORAL
  Filled 2013-01-29 (×3): qty 1

## 2013-01-29 NOTE — ED Notes (Signed)
Pt belongings in locker 42

## 2013-01-29 NOTE — ED Provider Notes (Signed)
Medical screening examination/treatment/procedure(s) were conducted as a shared visit with non-physician practitioner(s) and myself.  I personally evaluated the patient during the encounter.   Please see my separate note.      Candyce Churn, MD 01/29/13 [phone]

## 2013-01-29 NOTE — ED Notes (Signed)
Pt aaox3.  Pt reports "i am very upset.  This is wrong.  I shold not be in hear and i should go home."  Pt reports "my dad felt like I should be in hospital."  Pt denies SI/HI/AH/VH.  Pt upset at this time.  Pt not very talkative

## 2013-01-29 NOTE — Consult Note (Signed)
Attempted speaking with this patient but was unable due to her illness.  Patient is very anxious, pacing and walking around near the door wanting to leave the unit.  Patient stated " let me go home, I don't belong here"   Writer attempted further to communicate with patient but she yelled and asked her to leave her room.   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  Patient is in a manic state and was offered 2 mg po of Ativan but she took only one tablet.  Telephone consultation was done with Dr Elsie Saas who agrees that patient need to be admitted.  We will admit and transfer patient as soon as bed is available.  Patient was last admitted to our inpatient unit 01/13/2013 for Bipolar d/o mixed.  We will obtain full H/P when patient is stabilized. Dahlia Byes  PMHNP-BC  Reviewed the information documented and agree with the treatment plan.  Leandro Berkowitz,JANARDHAHA R. 01/30/2013 9:11 AM

## 2013-01-29 NOTE — ED Notes (Signed)
Pocket book and cell phone sent home with patient's father.

## 2013-01-29 NOTE — ED Notes (Signed)
Pt's father brought pt here for suicidal ideations.  Pt refused to come inside.  Pt was coerced into walking inside with nurse to talk.  Pt states that she is not suicidal and she doesn't know why she is here.  States she is angry with her Sales promotion account executive.

## 2013-01-29 NOTE — ED Provider Notes (Signed)
CSN: [id number]     Arrival date & time 01/29/13  1229 History   First MD Initiated Contact with Patient 01/29/13 1312     Chief Complaint  Patient presents with  . Medical Clearance    The history is provided by the patient and a relative. The history is limited by the condition of the patient (uncooperative).  Pt was seen at 1330.  Per pt's family, states pt is having SI and they brought her here for eval. Pt states she "doesn't want to be here" and is just angry at her property managers. 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 Has stated to ED staff she is "just stressed" and "anxious."  Denies SI, no SA, no HI.    Past Medical History  Diagnosis Date  . Hypertension   . Active smoker   . Hx of hernia repair   . Bipolar 1 disorder   . Depression   . Psychosis   . Paranoid delusion    Past Surgical History  Procedure Laterality Date  . Cholecystectomy    . Hernia repair      4   . Ankle surgery      2 right ankle  . Tonsillectomy    . Tubal ligation      History  Substance Use Topics  . Smoking status: Current Every Day Smoker -- 0.50 packs/day    Types: Cigarettes  . Smokeless tobacco: Not on file  . Alcohol Use: Yes     Comment: occasionally    Review of Systems  Unable to perform ROS: Psychiatric disorder    Allergies  Depakote and Wellbutrin  Home Medications   Current Outpatient Rx  Name  Route  Sig  Dispense  Refill  . albuterol (PROVENTIL HFA;VENTOLIN HFA) 108 (90 BASE) MCG/ACT inhaler   Inhalation   Inhale 2 puffs into the lungs every 6 (six) hours as needed for wheezing or shortness of breath. Related to Asthma.         . hydrochlorothiazide (MICROZIDE) 12.5 MG capsule   Oral   Take 1 capsule (12.5 mg total) by mouth daily. For edema and hypertension.   30 capsule   0   . hydrOXYzine (ATARAX/VISTARIL) 50 MG tablet   Oral   Take 50 mg by mouth 3 (three) times daily as needed for  anxiety.         Marland Kitchen lisinopril (PRINIVIL,ZESTRIL) 5 MG tablet   Oral   Take 5 mg by mouth daily.         Marland Kitchen lithium carbonate (ESKALITH) 450 MG CR tablet   Oral   Take 450 mg by mouth 2 (two) times daily.         . metroNIDAZOLE (FLAGYL) 500 MG tablet   Oral   Take 1 tablet (500 mg total) by mouth 2 (two) times daily.   14 tablet   0   . topiramate (TOPAMAX) 50 MG tablet   Oral   Take 50 mg by mouth 2 (two) times daily.         . cephALEXin (KEFLEX) 500 MG capsule   Oral   Take 1 capsule (500 mg total) by mouth 2 (two) times daily.   20 capsule   0   . clonazePAM (KLONOPIN) 0.5 MG tablet   Oral   Take 0.5 mg by mouth 2 (two) times daily as needed for anxiety.         . traZODone (  DESYREL) 50 MG tablet   Oral   Take 50 mg by mouth at bedtime as needed for sleep.          BP 158/78  Pulse 111  Temp(Src) 98.4 F (36.9 C) (Oral)  Resp 16  SpO2 100%  LMP 01/05/2013 Physical Exam 1335: Physical examination:  Nursing notes reviewed; Vital signs and O2 SAT reviewed;  Constitutional: Well developed, Well nourished, Well hydrated, In no acute distress; Head:  Normocephalic, atraumatic; Eyes: EOMI, PERRL, No scleral icterus; ENMT: Mouth and pharynx normal, Mucous membranes moist; Neck: Supple, Full range of motion, No lymphadenopathy; Cardiovascular: Regular rate and rhythm, No gallop; Respiratory: Breath sounds clear & equal bilaterally, No wheezes.  Speaking full sentences with ease, Normal respiratory effort/excursion; Chest: Nontender, Movement normal; Abdomen: Soft, Nontender, Nondistended, Normal bowel sounds; Genitourinary: No CVA tenderness; Extremities: Pulses normal, No tenderness, No edema, No calf edema or asymmetry.; Neuro: AA&Ox3, Major CN grossly intact.  Speech clear. No gross focal motor or sensory deficits in extremities.; Skin: Color normal, Warm, Dry.; Psych:  Easily agitated and argumentative, paranoid, rapid speech, poor eye contact.    ED Course   Procedures    MDM  MDM Reviewed: previous chart, nursing note and vitals Reviewed previous: labs, x-ray and CT scan Interpretation: labs     Results for orders placed during the hospital encounter of 01/29/13  CBC      Result Value Range   WBC 19.0 (*) 4.0 - 10.5 K/uL   RBC 4.82  3.87 - 5.11 MIL/uL   Hemoglobin 12.9  12.0 - 15.0 g/dL   HCT 16.1  09.6 - 04.5 %   MCV 80.7  78.0 - 100.0 fL   MCH 26.8  26.0 - 34.0 pg   MCHC 33.2  30.0 - 36.0 g/dL   RDW 40.9  81.1 - 91.4 %   Platelets 403 (*) 150 - 400 K/uL  URINE RAPID DRUG SCREEN (HOSP PERFORMED)      Result Value Range   Opiates NONE DETECTED  NONE DETECTED   Cocaine NONE DETECTED  NONE DETECTED   Benzodiazepines NONE DETECTED  NONE DETECTED   Amphetamines NONE DETECTED  NONE DETECTED   Tetrahydrocannabinol NONE DETECTED  NONE DETECTED   Barbiturates NONE DETECTED  NONE DETECTED  POCT PREGNANCY, URINE      Result Value Range   Preg Test, Ur NEGATIVE  NEGATIVE   Dg Chest 2 View 01/27/2013   *RADIOLOGY REPORT*  Clinical Data: Dizziness, confused, pain in left eye  CHEST - 2 VIEW  Comparison: 12/03/2012  Findings: The heart size and vascular pattern are normal.  Lungs are clear.  No pleural effusions.  No change from prior study.  IMPRESSION: No acute findings   Original Report Authenticated By: Esperanza Heir, M.D.   Ct Head Wo Contrast 01/27/2013   CLINICAL DATA:  Severe headache.  EXAM: CT HEAD WITHOUT CONTRAST  TECHNIQUE: Contiguous axial images were obtained from the base of the skull through the vertex without intravenous contrast.  COMPARISON:  None.  FINDINGS: The ventricles are normal. No extra-axial fluid collections are identified. There are few scattered dural calcifications but no CT findings for acute hemispheric infarction an or intracranial hemorrhage. No mass lesions. The brainstem and cerebellum appear normal.  The bony structures are intact. No skull fracture. The paranasal sinuses and mastoid air cells are  clear. The globes are intact.  IMPRESSION: No acute intracranial findings or mass lesion.   Electronically Signed   By: Luan Pulling.D.  On: 01/27/2013 23:57    1400:  WBC count elevated, but trending down compared to previous. Pt's 3rd ED visit in 3 days with bizarre behavior. Pt d/c last month from [facility] for psychosis and paranoid delusions. Pt repetitively asking "what papers did I sign when I came it?" and "let me see them."  Appears paranoid, is easily agitated and argumentative, but will calm with staff speaking with her. Pt moved back to psych ED; NP [place] aware. Pt will need eval by Psych team and likely admission.     Laray Anger, DO 01/29/13 [phone]

## 2013-01-30 ENCOUNTER — Encounter (HOSPITAL_COMMUNITY): Payer: Self-pay | Admitting: Registered Nurse

## 2013-01-30 ENCOUNTER — Encounter (HOSPITAL_COMMUNITY): Payer: Self-pay | Admitting: *Deleted

## 2013-01-30 ENCOUNTER — Inpatient Hospital Stay (HOSPITAL_COMMUNITY)
Admission: AD | Admit: 2013-01-30 | Discharge: 2013-02-02 | DRG: 885 | Disposition: A | Discharge status: Home / Self Care | Payer: Medicaid Other | Source: Intra-hospital | Attending: Psychiatry | Admitting: Psychiatry

## 2013-01-30 DIAGNOSIS — F315 Bipolar disorder, current episode depressed, severe, with psychotic features: Secondary | ICD-10-CM | POA: Diagnosis present

## 2013-01-30 DIAGNOSIS — F3110 Bipolar disorder, current episode manic without psychotic features, unspecified: Secondary | ICD-10-CM | POA: Diagnosis present

## 2013-01-30 DIAGNOSIS — F319 Bipolar disorder, unspecified: Secondary | ICD-10-CM

## 2013-01-30 LAB — LITHIUM LEVEL
Lithium Lvl: 0.68 mEq/L — ABNORMAL LOW (ref 0.80–1.40)
Lithium Lvl: 0.56 mEq/L — ABNORMAL LOW (ref 0.80–1.40)

## 2013-01-30 MED ORDER — HYDROCHLOROTHIAZIDE 12.5 MG PO CAPS
12.5000 mg | ORAL_CAPSULE | Freq: Every day | ORAL | Status: DC
  Administered 2013-01-31 – 2013-02-02 (×3): 12.5 mg via ORAL
  Filled 2013-01-30 (×5): qty 1

## 2013-01-30 MED ORDER — CLONAZEPAM 0.5 MG PO TABS
0.5000 mg | ORAL_TABLET | Freq: Two times a day (BID) | ORAL | Status: DC | PRN
  Administered 2013-01-31 – 2013-02-02 (×4): 0.5 mg via ORAL
  Filled 2013-01-30 (×4): qty 1

## 2013-01-30 MED ORDER — ALUM & MAG HYDROXIDE-SIMETH 200-200-20 MG/5ML PO SUSP: 30.0000 mL | ORAL | Status: DC | PRN

## 2013-01-30 MED ORDER — ALBUTEROL SULFATE HFA 108 (90 BASE) MCG/ACT IN AERS: 2.0000 | INHALATION_SPRAY | Freq: Four times a day (QID) | RESPIRATORY_TRACT | Status: DC | PRN

## 2013-01-30 MED ORDER — LITHIUM CARBONATE ER 450 MG PO TBCR
450.0000 mg | EXTENDED_RELEASE_TABLET | Freq: Two times a day (BID) | ORAL | Status: DC
  Administered 2013-01-30 – 2013-02-02 (×6): 450 mg via ORAL
  Filled 2013-01-30 (×8): qty 1
  Filled 2013-01-30 (×2): qty 6
  Filled 2013-01-30: qty 1

## 2013-01-30 MED ORDER — POTASSIUM CHLORIDE CRYS ER 20 MEQ PO TBCR
40.0000 meq | EXTENDED_RELEASE_TABLET | Freq: Two times a day (BID) | ORAL | Status: DC
  Administered 2013-01-30 – 2013-02-02 (×6): 40 meq via ORAL
  Filled 2013-01-30: qty 2
  Filled 2013-01-30: qty 4
  Filled 2013-01-30 (×9): qty 2

## 2013-01-30 MED ORDER — MAGNESIUM HYDROXIDE 400 MG/5ML PO SUSP: 30.0000 mL | Freq: Every day | ORAL | Status: DC | PRN

## 2013-01-30 MED ORDER — HYDROCHLOROTHIAZIDE 12.5 MG PO CAPS
12.5000 mg | ORAL_CAPSULE | Freq: Every day | ORAL | Status: DC
  Administered 2013-01-30: 12.5 mg via ORAL
  Filled 2013-01-30: qty 1

## 2013-01-30 MED ORDER — CLONAZEPAM 0.5 MG PO TABS
0.5000 mg | ORAL_TABLET | Freq: Two times a day (BID) | ORAL | Status: DC | PRN
  Administered 2013-01-30: 0.5 mg via ORAL
  Filled 2013-01-30: qty 1

## 2013-01-30 MED ORDER — TOPIRAMATE 25 MG PO TABS
50.0000 mg | ORAL_TABLET | Freq: Two times a day (BID) | ORAL | Status: DC
  Administered 2013-01-30 – 2013-02-02 (×6): 50 mg via ORAL
  Filled 2013-01-30 (×13): qty 2

## 2013-01-30 MED ORDER — TOPIRAMATE 25 MG PO TABS
50.0000 mg | ORAL_TABLET | Freq: Two times a day (BID) | ORAL | Status: DC
  Administered 2013-01-30: 50 mg via ORAL
  Filled 2013-01-30: qty 2

## 2013-01-30 MED ORDER — NICOTINE 21 MG/24HR TD PT24: 21.0000 mg | MEDICATED_PATCH | Freq: Every day | TRANSDERMAL | Status: DC | PRN

## 2013-01-30 MED ORDER — ACETAMINOPHEN 325 MG PO TABS: 650.0000 mg | ORAL_TABLET | Freq: Four times a day (QID) | ORAL | Status: DC | PRN

## 2013-01-30 MED ORDER — NICOTINE POLACRILEX 2 MG MT GUM
2.0000 mg | CHEWING_GUM | OROMUCOSAL | Status: DC | PRN
  Administered 2013-01-31 – 2013-02-02 (×6): 2 mg via ORAL
  Filled 2013-01-30: qty 1

## 2013-01-30 NOTE — ED Notes (Signed)
[facility] contacted for transport 

## 2013-01-30 NOTE — Progress Notes (Signed)
Underwriter initiated bed placement for inpatient psychiatric hospitalization on behalf of the pt.  The following hospitals were contacted as follows: 1)[facility]-fax 2)[facility]- no beds 3)[facility]-faxed referral 4)[facility]-faxed referral 5)[facility]- no beds 6)[facility]-fax referral 7)[facility]- faxed referral 8)[facility]-faxed referral 9)[facility]-faxed referral 10)[facility]- no beds

## 2013-01-30 NOTE — ED Notes (Signed)
shuvon np and dr Shela Commons into see
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
## 2013-01-30 NOTE — ED Notes (Signed)
Pt is aware that she is going to be admitted to [facility] and will transport today. Pt talking w/ [facility]

## 2013-01-30 NOTE — ED Notes (Signed)
Up to the desk on the phone 

## 2013-01-30 NOTE — ED Notes (Addendum)
Pt was able to reach her grandmother andn was able to calm down. Pt able to discuss her admission calmly, and verbalize her frustration w/ multiple recent admissions and her hopes that this admission will help her feel better and that she won't need other admissions.

## 2013-01-30 NOTE — ED Notes (Signed)
Pt calm, cooperative, ambulatory to [facility] w/ [facility].  [facility]

## 2013-01-30 NOTE — Progress Notes (Signed)
Adult Psychoeducational Group Note  Date:  01/30/2013 Time:  8:00 pm   Group Topic/Focus:  Wrap-Up Group:   The focus of this group is to help patients review their daily goal of treatment and discuss progress on daily workbooks.  Participation Level:  Active  Participation Quality:  Drowsy  Affect:  Lethargic  Cognitive:  Appropriate  Insight: Appropriate  Engagement in Group:  Limited  Modes of Intervention:  Discussion, Education, Socialization and Support  Additional Comments:  Pt was having a difficult time staying awake during the group. Pt stated that her medication was making her sleepy. Pt stated that she was at [facility] for a medication adjustment. Pt identified that she is strong willed and a great motivator when asked to share two positive character traits.   Shalandria Elsbernd 01/30/2013, 11:50 PM
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
## 2013-01-30 NOTE — ED Notes (Signed)
[facility] here to transport, pt up to the bathroom, 1 bag of belongings sent w/ pt

## 2013-01-30 NOTE — ED Notes (Addendum)
Pt up to the desk to call her father, pt aggitated, thinks that we anare trying to trick her, questioning why she is being admitted, pressured speech, talkative, defensive. Pt reasurred that we are not trying to trick her, reminded that she agreed to be admitted and that her Dr felt that she needed to be admitted.

## 2013-01-30 NOTE — ED Notes (Signed)
Cant sleep pt wanted to switch out rooms.  Pt moved to room 38.  Pt states "that otherroom have a root in it.  I dont want anything out that room."  Pt deneis AH,VH.

## 2013-01-30 NOTE — ED Notes (Signed)
Pt up in room to sign for belongings. Pt questioning why she is being transferred and where her other belongings are. Father took them home per chart,  Pt up to the desk to call to confirm this.

## 2013-01-30 NOTE — ED Notes (Signed)
Pt came up complaining that she couldn't sleep and didn't believe she needed to be in this hospital.

## 2013-01-30 NOTE — Progress Notes (Signed)
Writer observed patient sitting in the dayroom asleep while lab was waiting for her to do a blood draw. Patient reports that she is very sleepy and exhausted, Clinical research associate encouraged patient to have her blood draw done and take her scheduled hs meds and just lie down and get some rest. Patient was receptive and had blood draw done, meds taken and she went to lie down. Patient voiced no other complaints. Safety maintained with 15 min checks, will continue to monitor.

## 2013-01-30 NOTE — BHH Counselor (Signed)
Pt signed voluntary form but was not pleased with having to go to [facility] or inpatient.  Forms faxed to [facility].  Original will come with security.  It is recommended security take pt and not transportation agency as pt is not combative or a run risk but requires expertise of security to safely get to [facility]

## 2013-01-30 NOTE — ED Notes (Signed)
On the phone 

## 2013-01-30 NOTE — Progress Notes (Signed)
Received phone call from [facility] regarding placement, pt was declined stating she does not meet criteria (elevated WBC's, not HI or SI).  Tomi Bamberger, MHT

## 2013-01-30 NOTE — Progress Notes (Signed)
Underwriter contacted by Yvetta Coder regarding pt placement.  Pt denied due to Medicaid out of area.
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
## 2013-01-31 DIAGNOSIS — F3160 Bipolar disorder, current episode mixed, unspecified: Secondary | ICD-10-CM

## 2013-01-31 NOTE — BHH Group Notes (Signed)
[facility] LCSW Aftercare Discharge Planning Group Note   01/31/2013 8:45 AM  Participation Quality:  Alert and Appropriate   Mood/Affect:  Appropriate, Disorganized, Blunted  Depression Rating:  3  Anxiety Rating:  3  Thoughts of Suicide:  Pt denies SI/HI  Will you contract for safety?   Yes  Current AVH:  Yes  Plan for Discharge/Comments:  Pt attended discharge planning group and actively participated in group.  CSW provided pt with today's workbook.  Pt is very slow to respond, and when asked why pt states that she is concentrating to answer.  Pt appears to be responding to stimuli as she smiles randomly.  Pt admits to hearing voices.  Pt states that she came here because her dad thought she needed to be here.  Pt states that she wants to move to [place] to live with her mother.  Pt has been followed by [facility] but it appears minimally.  Discharge plans unknown at this time.  CSW will assess for appropriate referrals.  No further needs voiced by pt at this time.    Transportation Means: Pt reports access to transportation  Supports: No supports mentioned at this time  [name], LCSWA 01/31/2013 9:46 AM

## 2013-01-31 NOTE — Clinical Social Work Note (Signed)
CSW discussed ACT team services with pt this afternoon and pt agreed this would be a good plan for her upon discharge.  Pt states that she wouldn't be able to move to [place] anytime soon.  CSW made the referral to [facility] for ACTT and the team lead will come out on Wednesday to meet pt and begin services.    Alyssa Prince, LCSWA 01/31/2013  3:15 PM

## 2013-01-31 NOTE — Progress Notes (Signed)
Nutrition Brief Note  Patient identified on the Malnutrition Screening Tool (MST) Report.  Wt Readings from Last 10 Encounters:  01/30/13 260 lb (117.935 kg)  01/27/13 267 lb (121.11 kg)  01/09/13 262 lb (118.842 kg)  12/06/12 266 lb (120.657 kg)  11/29/12 254 lb (115.214 kg)  01/27/12 230 lb (104.327 kg)   Body mass index is 50.78 kg/(m^2). Patient meets criteria for class III extreme obesity based on current BMI.   Discussed intake PTA with patient and compared to intake presently.  Discussed changes in intake and encouraged adequate intake of meals and snacks. Pt reports eating 2-3 meals/day at home but recently was eating less than that r/t getting busy and cooking less. Per weight trend, weight down 7 pounds in the past 3 days, recommend nursing re-check this weight. States her appetite is improving.   Current diet order is regular and pt is also offered choice of unit snacks mid-morning and mid-afternoon.  Pt is eating as desired.    Labs and medications reviewed. Potassium slightly low, getting oral potassium chloride.   Nutrition Dx:  Inadequate oral intake related to poor appetite as evidenced by pt statement  Interventions:   Discussed the importance of nutrition and encouraged intake of food and beverages.      No additional nutrition interventions warranted at this time. If nutrition issues arise, please consult RD.   Levon Hedger MS, RD, LDN [phone] Pager [phone] After Hours Pager
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
## 2013-01-31 NOTE — Progress Notes (Signed)
Patient ID: Alyssa Prince, female   DOB: March 24, 1971, 42 y.o.   MRN: [id number] D- patient reports she slept well and that her appetite is good.  She reports normal energy on her self assessment but this morning said she felt exhausted.  She rates her depression at 1-2/10 and she denies thoughts of self harm.  A- supported patient.  R- patient is attending most groups but frequently comes in late.  She is presently sleeping upright in chair in her room.

## 2013-01-31 NOTE — [facility] Counselor (Signed)
Adult Psychosocial Assessment Update Interdisciplinary Team  Previous [facility] Hospital admissions/discharges:  Admissions Discharges  Date:01/09/13 Date: 01/13/13  Date: 12/06/12 Date: 12/09/12  Date: Date:  Date: Date:  Date: Date:   Changes since the last Psychosocial Assessment (including adherence to outpatient mental health and/or substance abuse treatment, situational issues contributing to decompensation and/or relapse). Pt was admitted to the hospital for SI but denies.  There were also concerns of pt inability to care for herself at home due to paranoia and lack of follow up.  Pt was to follow up with [facility] for medication management and therapy but minimally follows up.  Pt also reports hearing voices and has a delayed response, needing much prompting and appears to be responding to internal stimuli.               Discharge Plan 1. Will you be returning to the same living situation after discharge?   Yes: X Pt has own place in [place] but reports a  Desire to move to [place], [place] where her mother resides.  No:      If no, what is your plan?           2. Would you like a referral for services when you are discharged? Yes:   X  If yes, for what services?  CSW will assess for appropriate referrals, discussing a referral for an ACT team  No:              Summary and Recommendations (to be completed by the evaluator) Patient is a 42 year old African American female with a diagnosis of Bipolar Disorder - Depressed.  Patient lives in [place] alone.  Patient will benefit from crisis stabilization, medication evaluation, group therapy and psycho education in addition to case management for discharge planning.                         Signature:  [name], 01/31/2013 11:42 AM

## 2013-01-31 NOTE — [facility] Suicide Risk Assessment (Signed)
Suicide Risk Assessment  Admission Assessment     Nursing information obtained from:  Patient Demographic factors:  Low socioeconomic status;Unemployed Current Mental Status:  NA (denies any si/hi/av. ) Loss Factors:  Financial problems / change in socioeconomic status Historical Factors:  Prior suicide attempts;Impulsivity Risk Reduction Factors:  Responsible for children under 40 years of age;Sense of responsibility to family;Religious beliefs about death;Living with another person, especially a relative;Positive social support  CLINICAL FACTORS:   Severe Anxiety and/or Agitation Bipolar Disorder:   Mixed State Currently Psychotic Unstable or Poor Therapeutic Relationship Previous Psychiatric Diagnoses and Treatments Medical Diagnoses and Treatments/Surgeries  COGNITIVE FEATURES THAT CONTRIBUTE TO RISK:  Closed-mindedness Loss of executive function Polarized thinking Thought constriction (tunnel vision)    SUICIDE RISK:   Moderate:  Frequent suicidal ideation with limited intensity, and duration, some specificity in terms of plans, no associated intent, good self-control, limited dysphoria/symptomatology, some risk factors present, and identifiable protective factors, including available and accessible social support.  PLAN OF CARE: Admitted voluntarily and emergently [facility] for bipolar disorder with mixed symptoms and paranoid and delusional.   I certify that inpatient services furnished can reasonably be expected to improve the patient's condition.  Alyssa Prince,Alyssa R. 01/31/2013, 1:03 PM

## 2013-01-31 NOTE — [facility] Group Notes (Signed)
[facility] LCSW Group Therapy  01/31/2013  1:15 PM   Type of Therapy:  Group Therapy  Participation Level:  Active  Participation Quality:  Appropriate and Attentive  Affect:  Appropriate, Blunted  Cognitive:  Alert and Appropriate  Insight:  Developing/Improving and Engaged  Engagement in Therapy:  Developing/Improving and Engaged  Modes of Intervention:  Clarification, Confrontation, Discussion, Education, Exploration, Limit-setting, Orientation, Problem-solving, Rapport Building, Dance movement psychotherapist, Socialization and Support  Summary of Progress/Problems: Pt identified obstacles faced currently and processed barriers involved in overcoming these obstacles. Pt identified steps necessary for overcoming these obstacles and explored motivation (internal and external) for facing these difficulties head on. Pt further identified one area of concern in their lives and chose a goal to focus on for today.  Pt only shared that she is taking it day by day and her goal is to get out of the hospital.  CSW asked pt what she can do to show she can leave the hospital and pt stated participate in groups and show good behavior, which she feels she is doing.  Pt continues to have delayed responses and stares blankly.    Alyssa Prince, [place] 01/31/2013 2:33 PM

## 2013-01-31 NOTE — Progress Notes (Signed)
Adult Psychoeducational Group Note  Date:  01/31/2013 Time:  11:00am  Group Topic/Focus:  Wellness Toolbox:   The focus of this group is to discuss various aspects of wellness, balancing those aspects and exploring ways to increase the ability to experience wellness.  Patients will create a wellness toolbox for use upon discharge.  Participation Level:  Minimal  Participation Quality:  Attentive  Affect:  Appropriate  Cognitive:  Alert and Appropriate  Insight: Limited  Engagement in Group:  Engaged  Modes of Intervention:  Discussion and Education  Additional Comments:  Pt attended group late but was very minimal in her discussion.  Shelly Bombard D 01/31/2013, 1:08 PM

## 2013-01-31 NOTE — Clinical Social Work Note (Signed)
CSW provided pt with a shirt and pants from the clothes closet, per pt's request.    [name], LCSWA 01/31/2013  2:28 PM

## 2013-01-31 NOTE — Progress Notes (Signed)
Recreation Therapy Notes  Date: 10.06.2014 Time: 3:00pm Location: [address] Dayroom  Group Topic: Wellness  Goal Area(s) Addresses:  Patient will verbalize benefit of whole wellness. Patient will identify at least two ways they are investing in each defined dimension of whole wellness.  Behavioral Response: Engaged, Attentive, Appropriate  Intervention: Air traffic controller  Activity: Wellness Flower. Patients were provided a worksheet with six dimensions of whole wellness - Wellness, Relationships, Physical Environment, Fun & Creativity, Personal Development, & Future. Patients were asked to identify two ways they are personally investing in each dimension.   Education: Discharge Planning.    Education Outcome: Acknowledges understanding   Clinical Observations/Feedback: Patient attended group session, completing worksheet as requested. Patient did not share items from her worksheet and made no contributions to group discussion, but appeared to actively listen as she maintained appropriate eye contact with speaker.   Marykay Lex Beila Purdie, LRT/CTRS  Jearl Klinefelter 01/31/2013 3:49 PM

## 2013-01-31 NOTE — [facility] Suicide Risk Assessment (Signed)
[facility] Adult Inpatient Family/Significant Other Suicide Prevention Education  Suicide Prevention Education:   Patient Refusal for Family/Significant Other Suicide Prevention Education: The patient has refused to provide written consent for family/significant other to be provided Family/Significant Other Suicide Prevention Education during admission and/or prior to discharge.  Physician notified.  CSW provided suicide prevention information with patient.    The suicide prevention education provided includes the following:  Suicide risk factors  Suicide prevention and interventions  National Suicide Hotline telephone number  [facility] assessment telephone number  [facility] Emergency Assistance 911  [facility] and/or Residential Mobile Crisis Unit telephone number   [name], [place] 01/31/2013 10:42 AM

## 2013-01-31 NOTE — Tx Team (Signed)
Interdisciplinary Treatment Plan Update (Adult)  Date: 01/31/2013  Time Reviewed:  9:45 AM  Progress in Treatment: Attending groups: Yes Participating in groups:  Yes Taking medication as prescribed:  Yes Tolerating medication:  Yes Family/Significant othe contact made: No, pt refused Patient understands diagnosis:  Yes Discussing patient identified problems/goals with staff:  Yes Medical problems stabilized or resolved:  Yes Denies suicidal/homicidal ideation: Yes Issues/concerns per patient self-inventory:  Yes Other:  New problem(s) identified: N/A  Discharge Plan or Barriers: CSW assessing for appropriate referrals.  Reason for Continuation of Hospitalization: Anxiety Depression Auditory hallucinations Medication Stabilization  Comments: N/A  Estimated length of stay: 3-5 days  For review of initial/current patient goals, please see plan of care.  Attendees: Patient:     Family:     Physician:  Dr. Javier Glazier 01/31/2013 10:39 AM   Nursing:   Neill Loft, RN 01/31/2013 10:39 AM   Clinical Social Worker:  [name], LCSWA 01/31/2013 10:39 AM   Other: Verne Spurr, PA 01/31/2013 10:39 AM   Other:  Frankey Shown, MA care coordination 01/31/2013 10:39 AM   Other:  Juline Patch, LCSW 01/31/2013 10:39 AM   Other:     Other:    Other:    Other:    Other:    Other:    Other:     Scribe for Treatment Team:   [name], 01/31/2013 10:39 AM

## 2013-02-01 NOTE — [facility] Group Notes (Signed)
[facility] LCSW Group Therapy  02/01/2013  1:15 PM   Type of Therapy:  Group Therapy  Participation Level:  Minimal  Participation Quality:  Appropriate but sleeping  Affect:  Drowsy  Cognitive:  Inattentive, drowsy   Insight:  Limited  Engagement in Therapy:  Limited   Modes of Intervention:  Activity, Clarification, Confrontation, Discussion, Education, Exploration, Limit-setting, Orientation, Problem-solving, Rapport Building, Dance movement psychotherapist, Socialization and Support  Summary of Progress/Problems: Patient was attentive and engaged with speaker from [employer].  Patient was attentive to speaker while they shared their story of dealing with mental health and overcoming it.  Patient expressed interest in their programs and services and received information on their agency.  Patient processed ways they can relate to the speaker.   Pt slept for a majority of the group but quietly listened to the speaker when awake.    Alyssa Prince, LCSWA 02/01/2013 2:19 PM

## 2013-02-01 NOTE — Progress Notes (Signed)
Recreation Therapy Notes  Date: 10.07.2014 Time: 2:30pm Location: [address] Dayroom  Group Topic: Animal Assisted Activities (AAA)  Behavioral Response: Did not attend  Teriyah Purington L Usha Slager, LRT/CTRS  Jermika Olden L 02/01/2013 4:26 PM 

## 2013-02-01 NOTE — Progress Notes (Signed)
  D) Patient quiet but cooperative upon my assessment. Patient completed Patient Self Inventory, reports slept "well," and  appetite is "improving." Patient rates depression as   1-2/10, patient rates hopeless feelings as  1-2/10. Patient denies SI/HI, denies A/V hallucinations. Patient verbalizes "one of these new medications is making me feel sleepy all day." Patient encouraged to take medications as ordered and follow up with MD re new drowsiness, verbalizes understanding.   A) Patient offered support and encouragement, patient encouraged to discuss feelings/concerns with staff. Patient verbalized understanding. Patient monitored Q15 minutes for safety. Patient met with MD  to discuss today's goals and plan of care.  R) Patient visible in milieu, attending groups in day room and meals in dining room. Patient appropriate with staff and peers.   Patient taking medications as ordered. Will continue to monitor.

## 2013-02-01 NOTE — Progress Notes (Signed)
Adult Psychoeducational Group Note  Date:  02/01/2013 Time: 0900am  Group Topic/Focus:  Orientation:   The focus of this group is to educate the patient on the purpose and policies of crisis stabilization and provide a format to answer questions about their admission.  The group details unit policies and expectations of patients while admitted.  Participation Level:  Active  Participation Quality:  Appropriate and Attentive  Affect:  Appropriate  Cognitive:  Alert and Appropriate  Insight: Appropriate  Engagement in Group:  Engaged and Supportive  Modes of Intervention:  Discussion and Education  Additional Comments:  Patient appropriate and engaged.   Noah Charon 02/01/2013, 11:10 AM

## 2013-02-01 NOTE — Progress Notes (Signed)
  Pt attended spiritual care group on grief and loss facilitated by chaplain Burnis Kingfisher.   Group opened with brief discussion and psycho-social ed around grief and loss in relationships and in relation to self - identifying life patterns, circumstances, changes that cause losses. Established group norm of speaking from own life experience.  Pt's then engaged one another around significant losses in their lives, exploring how these losses affected them, what norms and themes they can identify around grief, and providing support for one another.    Alyssa Prince expressed frustration with lack of family support and expressed to group that her understanding of herself as a leader means that she feels she cannot be open to support from everyone.  Another group member identified with Alyssa Prince and offered support when she stated that her hospitalization affects how her family views her and their willingness to offer support.

## 2013-02-01 NOTE — Progress Notes (Signed)
Adult Psychoeducational Group Note  Date:  02/01/2013 Time:  10:04 PM  Group Topic/Focus:  Goals Group:   The focus of this group is to help patients establish daily goals to achieve during treatment and discuss how the patient can incorporate goal setting into their daily lives to aide in recovery.  Participation Level:  Active  Participation Quality:  Appropriate  Affect:  Appropriate  Cognitive:  Appropriate  Insight: Appropriate  Engagement in Group:  Engaged  Modes of Intervention:  Discussion  Additional Comments:  Pt stated that what made her day was the others in the group gave her positive feed back on her person.  Terie Purser R 02/01/2013, 10:04 PM

## 2013-02-01 NOTE — Progress Notes (Signed)
[facility] MD Progress Note  02/01/2013 10:13 AM Alyssa Prince  MRN:  [id number] Subjective: "I am working on to get a new place and new resources instead of coming back to ER and new support." My case manager is working to get me some support groups and planning to moving into new area. She continue to be paranoid about security getting into her apartment and does not feel safe in the same place to live. She has been compliant with her medication and no adverse effects.   Diagnosis:   DSM5: Schizophrenia Disorders:   Obsessive-Compulsive Disorders:   Trauma-Stressor Disorders:   Substance/Addictive Disorders:   Depressive Disorders:    Axis I: Bipolar, mixed  ADL's:  Impaired  Sleep: Fair  Appetite:  Fair  Suicidal Ideation:  denied Homicidal Ideation:  denied AEB (as evidenced by):  Psychiatric Specialty Exam: ROS  Blood pressure 116/73, pulse 80, temperature 98.1 F (36.7 C), temperature source Oral, resp. rate 17, height 5' (1.524 m), weight 117.935 kg (260 lb), last menstrual period 01/05/2013, SpO2 100.00%.Body mass index is 50.78 kg/(m^2).  General Appearance: Bizarre, Disheveled and Guarded  Eye Contact::  Minimal  Speech:  Clear and Coherent  Volume:  Decreased  Mood:  Angry, Anxious, Depressed and Irritable  Affect:  Labile  Thought Process:  Goal Directed and Intact  Orientation:  Full (Time, Place, and Person)  Thought Content:  Paranoid Ideation and Rumination  Suicidal Thoughts:  No  Homicidal Thoughts:  No  Memory:  Immediate;   Fair  Judgement:  Impaired  Insight:  Lacking  Psychomotor Activity:  Restlessness  Concentration:  Fair  Recall:  Fair  Akathisia:  NA  Handed:  Right  AIMS (if indicated):     Assets:  Communication Skills Desire for Improvement Physical Health Resilience Social Support  Sleep:  Number of Hours: 5.75   Current Medications: Current Facility-Administered Medications  Medication Dose Route Frequency Provider Last Rate Last  Dose  . acetaminophen (TYLENOL) tablet 650 mg  650 mg Oral Q6H PRN Shuvon Rankin, NP      . albuterol (PROVENTIL HFA;VENTOLIN HFA) 108 (90 BASE) MCG/ACT inhaler 2 puff  2 puff Inhalation Q6H PRN Shuvon Rankin, NP      . alum & mag hydroxide-simeth (MAALOX/MYLANTA) 200-200-20 MG/5ML suspension 30 mL  30 mL Oral Q4H PRN Shuvon Rankin, NP      . clonazePAM (KLONOPIN) tablet 0.5 mg  0.5 mg Oral BID PRN Shuvon Rankin, NP   0.5 mg at 01/31/13 2130  . hydrochlorothiazide (MICROZIDE) capsule 12.5 mg  12.5 mg Oral Daily Shuvon Rankin, NP   12.5 mg at 02/01/13 0759  . lithium carbonate (ESKALITH) CR tablet 450 mg  450 mg Oral Q12H Shuvon Rankin, NP   450 mg at 02/01/13 0759  . magnesium hydroxide (MILK OF MAGNESIA) suspension 30 mL  30 mL Oral Daily PRN Shuvon Rankin, NP      . nicotine polacrilex (NICORETTE) gum 2 mg  2 mg Oral PRN Nehemiah Settle, MD   2 mg at 02/01/13 0803  . potassium chloride SA (K-DUR,KLOR-CON) CR tablet 40 mEq  40 mEq Oral BID Shuvon Rankin, NP   40 mEq at 02/01/13 0800  . topiramate (TOPAMAX) tablet 50 mg  50 mg Oral BID Shuvon Rankin, NP   50 mg at 02/01/13 8295    Lab Results:  Results for orders placed during the hospital encounter of 01/30/13 (from the past 48 hour(s))  LITHIUM LEVEL     Status: Abnormal   Collection  Time    01/30/13  7:23 PM      Result Value Range   Lithium Lvl 0.56 (*) 0.80 - 1.40 mEq/L   Comment: Performed at [facility]    Physical Findings: AIMS: Facial and Oral Movements Muscles of Facial Expression: None, normal Lips and Perioral Area: None, normal Jaw: None, normal Tongue: None, normal,Extremity Movements Upper (arms, wrists, hands, fingers): None, normal Lower (legs, knees, ankles, toes): None, normal, Trunk Movements Neck, shoulders, hips: None, normal, Overall Severity Severity of abnormal movements (highest score from questions above): None, normal Incapacitation due to abnormal movements: None,  normal Patient's awareness of abnormal movements (rate only patient's report): No Awareness, Dental Status Current problems with teeth and/or dentures?: No Does patient usually wear dentures?: No  CIWA:    COWS:     Treatment Plan Summary: Daily contact with patient to assess and evaluate symptoms and progress in treatment Medication management  Plan: Continue current medication management Disposition plans in progress  Treatment Plan/Recommendations:  1. Admit for crisis management and stabilization. 2. Medication management to reduce current symptoms to base line and improve the patient's overall level of functioning. 3. Treat health problems as indicated. 4. Develop treatment plan to decrease risk of relapse upon discharge and to reduce the need for readmission. 5. Psycho-social education regarding relapse prevention and self care. 6. Health care follow up as needed for medical problems. 7. Restart home medications where appropriate.   Medical Decision Making Problem Points:  Established problem, worsening (2), Review of last therapy session (1) and Review of psycho-social stressors (1) Data Points:  Review or order clinical lab tests (1) Review or order medicine tests (1) Review of medication regiment & side effects (2) Review of new medications or change in dosage (2)  I certify that inpatient services furnished can reasonably be expected to improve the patient's condition.   Braxen Dobek,JANARDHAHA R. 02/01/2013, 10:13 AM

## 2013-02-01 NOTE — Progress Notes (Signed)
Adult Psychoeducational Group Note  Date:  02/01/2013 Time:  1:10 PM  Group Topic/Focus:  Recovery Goals:   The focus of this group is to identify appropriate goals for recovery and establish a plan to achieve them.  Participation Level:  Minimal  Participation Quality:  Drowsy  Affect:  Appropriate  Cognitive:  Appropriate  Insight: Appropriate  Engagement in Group:  Limited  Modes of Intervention:  Discussion and Education  Additional Comments:  Pt attended and participated in group. Discussion today was on recovery and pt stated recovery means one day at a time moving forward and having a game plan.  Shelly Bombard D 02/01/2013, 1:10 PM

## 2013-02-02 ENCOUNTER — Other Ambulatory Visit (HOSPITAL_COMMUNITY): Payer: Self-pay | Admitting: Physician Assistant

## 2013-02-02 MED ORDER — TOPIRAMATE 50 MG PO TABS: 50.0000 mg | ORAL_TABLET | Freq: Two times a day (BID) | ORAL | Status: DC

## 2013-02-02 MED ORDER — POTASSIUM CHLORIDE CRYS ER 20 MEQ PO TBCR: 40.0000 meq | EXTENDED_RELEASE_TABLET | Freq: Two times a day (BID) | ORAL | Status: DC

## 2013-02-02 MED ORDER — LITHIUM CARBONATE ER 450 MG PO TBCR: 450.0000 mg | EXTENDED_RELEASE_TABLET | Freq: Two times a day (BID) | ORAL | Status: DC

## 2013-02-02 MED ORDER — LISINOPRIL 5 MG PO TABS: 5.0000 mg | ORAL_TABLET | Freq: Every day | ORAL | Status: DC

## 2013-02-02 NOTE — Tx Team (Signed)
Interdisciplinary Treatment Plan Update (Adult)  Date: 02/02/2013  Time Reviewed:  9:45 AM  Progress in Treatment: Attending groups: Yes Participating in groups:  Yes Taking medication as prescribed:  Yes Tolerating medication:  Yes Family/Significant othe contact made: No, pt refused Patient understands diagnosis:  Yes Discussing patient identified problems/goals with staff:  Yes Medical problems stabilized or resolved:  Yes Denies suicidal/homicidal ideation: Yes Issues/concerns per patient self-inventory:  Yes Other:  New problem(s) identified: N/A  Discharge Plan or Barriers: Pt will follow up at [facility] for ACT team services.    Reason for Continuation of Hospitalization: Stable to d/c today  Comments: N/A  Estimated length of stay: D/C today  For review of initial/current patient goals, please see plan of care.  Attendees: Patient:  Alyssa Prince 02/02/2013 10:28 AM   Family:     Physician:  Dr. Javier Glazier 02/02/2013 10:28 AM   Nursing:   Dellia Cloud, RN 02/02/2013 10:28 AM   Clinical Social Worker:  [name], LCSWA 02/02/2013 10:28 AM   Other: Lowella Grip, RN 02/02/2013 10:28 AM   Other:  Frankey Shown, MA care coordination 02/02/2013 10:28 AM   Other:  Juline Patch, LCSW 02/02/2013 10:28 AM   Other:     Other:    Other:    Other:    Other:    Other:      Scribe for Treatment Team:   [name], 02/02/2013 , 10:28 AM

## 2013-02-02 NOTE — [facility] Suicide Risk Assessment (Signed)
Suicide Risk Assessment  Discharge Assessment     Demographic Factors:  Adolescent or young adult, Low socioeconomic status and Living alone  Mental Status Per Nursing Assessment::   On Admission:  NA (denies any si/hi/av. )  Current Mental Status by Physician: Mental Status Examination: Patient appeared as per his stated age, casually dressed, and fairly groomed, and maintaining good eye contact. Patient has good mood and his affect was constricted. He has normal rate, rhythm, and volume of speech. His thought process is linear and goal directed. Patient has denied suicidal, homicidal ideations, intentions or plans. Patient has no evidence of auditory or visual hallucinations, delusions, and paranoia. Patient has fair insight judgment and impulse control.  Loss Factors: Decrease in vocational status and Financial problems/change in socioeconomic status  Historical Factors: Prior suicide attempts and Impulsivity  Risk Reduction Factors:   Responsible for children under 27 years of age, Sense of responsibility to family, Religious beliefs about death, Living with another person, especially a relative, Positive social support, Positive therapeutic relationship and Positive coping skills or problem solving skills  Continued Clinical Symptoms:  Bipolar Disorder:   Mixed State Depression:   Recent sense of peace/wellbeing Previous Psychiatric Diagnoses and Treatments  Cognitive Features That Contribute To Risk:  Polarized thinking    Suicide Risk:  Minimal: No identifiable suicidal ideation.  Patients presenting with no risk factors but with morbid ruminations; may be classified as minimal risk based on the severity of the depressive symptoms  Discharge Diagnoses:   AXIS I:  Bipolar, mixed AXIS II:  Deferred AXIS III:   Past Medical History  Diagnosis Date  . Hypertension   . Active smoker   . Hx of hernia repair   . Bipolar 1 disorder   . Depression   . Psychosis   .  Paranoid delusion    AXIS IV:  economic problems, occupational problems, other psychosocial or environmental problems, problems related to social environment and problems with primary support group AXIS V:  61-70 mild symptoms  Plan Of Care/Follow-up recommendations:  Activity:  As tolerated Diet:  Regular  Is patient on multiple antipsychotic therapies at discharge:  No   Has Patient had three or more failed trials of antipsychotic monotherapy by history:  No  Recommended Plan for Multiple Antipsychotic Therapies: NA  Alyssa Prince,JANARDHAHA R. 02/02/2013, 12:27 PM

## 2013-02-02 NOTE — Progress Notes (Signed)
Adult Psychoeducational Group Note  Date:  02/02/2013 Time: 11:00am Group Topic/Focus:  Personal Choices and Values:   The focus of this group is to help patients assess and explore the importance of values in their lives, how their values affect their decisions, how they express their values and what opposes their expression.  Participation Level:  Did Not Attend  Participation Quality:    Affect:    Cognitive:    Insight:   Engagement in Group:    Modes of Intervention:    Additional Comments:  Pt did not attend group  Shelly Bombard D 02/02/2013, 2:16 PM

## 2013-02-02 NOTE — Progress Notes (Signed)
[facility] Adult Case Management Discharge Plan :  Will you be returning to the same living situation after discharge: Yes,  returning to own home At discharge, do you have transportation home?:Yes,  family picked pt up Do you have the ability to pay for your medications:Yes,  access to meds  Release of information consent forms completed and in the chart;  Patient's signature needed at discharge.  Patient to Follow up at: Follow-up Information   Follow up with [facility] On 02/03/2013. (Pt to follow up with Joe [phone]) ACT team services)    Contact information:   [address] [place], [place] [address] Phone: [phone] Fax: [phone]      Patient denies SI/HI:   Yes,  denies SI/HI    Safety Planning and Suicide Prevention discussed:  Yes,  discussed with pt, pt refused consent to contact family/friend.  See suicide prevention education note.    Alyssa Prince 02/02/2013, 3:07 PM

## 2013-02-02 NOTE — [facility] Group Notes (Signed)
[facility] LCSW Aftercare Discharge Planning Group Note   02/02/2013  8:45 AM  Participation Quality:  Did Not Attend  [name], LCSWA 02/02/2013 9:13 AM

## 2013-02-02 NOTE — [facility] Group Notes (Signed)
[facility] LCSW Group Therapy  02/02/2013  1:15 PM   Type of Therapy:  Group Therapy  Participation Level:  Active  Participation Quality:  Appropriate and Attentive  Affect:  Appropriate  Cognitive:  Alert and Appropriate  Insight:  Developing/Improving and Engaged  Engagement in Therapy:  Developing/Improving and Engaged  Modes of Intervention:  Clarification, Confrontation, Discussion, Education, Exploration, Limit-setting, Orientation, Problem-solving, Rapport Building, Dance movement psychotherapist, Socialization and Support  Summary of Progress/Problems: The topic for group today was emotional regulation.  This group focused on both positive and negative emotion identification and allowed group members to process ways to identify feelings, regulate negative emotions, and find healthy ways to manage internal/external emotions. Group members were asked to reflect on a time when their reaction to an emotion led to a negative outcome and explored how alternative responses using emotion regulation would have benefited them. Group members were also asked to discuss a time when emotion regulation was utilized when a negative emotion was experienced. Pt appeared to actively listen to group discussion but minimally participated.  Pt did share that she writes things down when she is dealing with negative emotions.    [name], LCSWA 02/02/2013 2:32 PM
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
## 2013-02-02 NOTE — Progress Notes (Signed)
Discharge Note: Discharge instructions/prescriptions/medication samples given to patient. Patient verbalized understanding of discharge instructions and prescriptions. Returned belongings to patient. Denies SI/HI/AVH. Patient d/c without incident to the lobby. 

## 2013-02-02 NOTE — Progress Notes (Signed)
Patient ID: Alyssa Prince, female   DOB: 09-Jun-1970, 42 y.o.   MRN: [id number]  D: Patient lying in bed with eyes closed. Respirations even and non-labored. A: Staff will monitor on q 15 minute checks, follow treatment plan, and give meds as ordered. R: Appears to be sleeping.

## 2013-02-03 NOTE — ED Provider Notes (Signed)
Medical screening examination/treatment/procedure(s) were performed by non-physician practitioner and as supervising physician I was immediately available for consultation/collaboration.  Keeanna Villafranca M Analeise Mccleery, MD 02/03/13 1506 

## 2013-02-07 NOTE — Progress Notes (Signed)
Patient Discharge Instructions:  After Visit Summary (AVS):   Faxed to:  02/07/13 Discharge Summary Note:   Faxed to:  02/07/13 Psychiatric Admission Assessment Note:   Faxed to:  02/07/13 Suicide Risk Assessment - Discharge Assessment:   Faxed to:  02/07/13 Faxed/Sent to the Next Level Care provider:  02/07/13 Faxed to [facility] @ [phone]  Jerelene Redden, 02/07/2013, 4:05 PM

## 2013-03-12 ENCOUNTER — Encounter (HOSPITAL_COMMUNITY): Payer: Self-pay | Admitting: Emergency Medicine

## 2013-03-12 ENCOUNTER — Emergency Department (HOSPITAL_COMMUNITY): Payer: Medicaid Other

## 2013-03-12 ENCOUNTER — Emergency Department (HOSPITAL_COMMUNITY)
Admission: EM | Admit: 2013-03-12 | Discharge: 2013-03-12 | Disposition: A | Discharge status: Home / Self Care | Payer: Medicaid Other | Attending: Emergency Medicine | Admitting: Emergency Medicine

## 2013-03-12 DIAGNOSIS — Z3202 Encounter for pregnancy test, result negative: Secondary | ICD-10-CM | POA: Insufficient documentation

## 2013-03-12 DIAGNOSIS — R0789 Other chest pain: Secondary | ICD-10-CM | POA: Insufficient documentation

## 2013-03-12 DIAGNOSIS — F419 Anxiety disorder, unspecified: Secondary | ICD-10-CM

## 2013-03-12 DIAGNOSIS — F411 Generalized anxiety disorder: Secondary | ICD-10-CM | POA: Insufficient documentation

## 2013-03-12 DIAGNOSIS — Z79899 Other long term (current) drug therapy: Secondary | ICD-10-CM | POA: Insufficient documentation

## 2013-03-12 DIAGNOSIS — R41840 Attention and concentration deficit: Secondary | ICD-10-CM | POA: Insufficient documentation

## 2013-03-12 DIAGNOSIS — F3130 Bipolar disorder, current episode depressed, mild or moderate severity, unspecified: Secondary | ICD-10-CM | POA: Insufficient documentation

## 2013-03-12 DIAGNOSIS — I10 Essential (primary) hypertension: Secondary | ICD-10-CM | POA: Insufficient documentation

## 2013-03-12 DIAGNOSIS — F17200 Nicotine dependence, unspecified, uncomplicated: Secondary | ICD-10-CM | POA: Insufficient documentation

## 2013-03-12 LAB — CBC WITH DIFFERENTIAL/PLATELET
Basophils Absolute: 0 10*3/uL (ref 0.0–0.1)
Basophils Relative: 0 % (ref 0–1)
Eosinophils Relative: 2 % (ref 0–5)
HCT: 38 % (ref 36.0–46.0)
Hemoglobin: 12.3 g/dL (ref 12.0–15.0)
Lymphs Abs: 5 10*3/uL — ABNORMAL HIGH (ref 0.7–4.0)
MCHC: 32.4 g/dL (ref 30.0–36.0)
Monocytes Absolute: 0.8 10*3/uL (ref 0.1–1.0)
Neutro Abs: 10.2 10*3/uL — ABNORMAL HIGH (ref 1.7–7.7)
Neutrophils Relative %: 62 % (ref 43–77)
Platelets: 308 10*3/uL (ref 150–400)
RDW: 14.7 % (ref 11.5–15.5)
WBC: 16.4 10*3/uL — ABNORMAL HIGH (ref 4.0–10.5)

## 2013-03-12 LAB — COMPREHENSIVE METABOLIC PANEL
ALT: 8 U/L (ref 0–35)
AST: 9 U/L (ref 0–37)
Albumin: 3.5 g/dL (ref 3.5–5.2)
Alkaline Phosphatase: 92 U/L (ref 39–117)
CO2: 24 mEq/L (ref 19–32)
Calcium: 9.7 mg/dL (ref 8.4–10.5)
Chloride: 106 mEq/L (ref 96–112)
Creatinine, Ser: 0.9 mg/dL (ref 0.50–1.10)
GFR calc Af Amer: >90 mL/min (ref >90)
Glucose, Bld: 115 mg/dL — ABNORMAL HIGH (ref 70–99)
Potassium: 3.3 mEq/L — ABNORMAL LOW (ref 3.5–5.1)
Sodium: 139 mEq/L (ref 135–145)

## 2013-03-12 LAB — URINALYSIS, ROUTINE W REFLEX MICROSCOPIC
Glucose, UA: NEGATIVE mg/dL
Leukocytes, UA: NEGATIVE
Nitrite: NEGATIVE
Specific Gravity, Urine: 1.01 (ref 1.005–1.030)
pH: 6.5 (ref 5.0–8.0)

## 2013-03-12 LAB — LITHIUM LEVEL: Lithium Lvl: 0.59 mEq/L — ABNORMAL LOW (ref 0.80–1.40)

## 2013-03-12 LAB — TROPONIN I: Troponin I: <0.3 ng/mL (ref <0.30)

## 2013-03-12 LAB — PREGNANCY, URINE: Preg Test, Ur: NEGATIVE

## 2013-03-12 MED ORDER — LORAZEPAM 1 MG PO TABS
1.0000 mg | ORAL_TABLET | Freq: Once | ORAL | Status: AC
  Administered 2013-03-12: 1 mg via ORAL
  Filled 2013-03-12: qty 1

## 2013-03-12 NOTE — ED Notes (Signed)
Patient ambulatory to restroom, requested that she obtain a urine specimen.

## 2013-03-12 NOTE — ED Provider Notes (Signed)
CSN: [id number]     Arrival date & time 03/12/13  1711 History   First MD Initiated Contact with Patient 03/12/13 1926     Chief Complaint  Patient presents with  . Medication Refill   (Consider location/radiation/quality/duration/timing/severity/associated sxs/prior Treatment) HPI Comments: Patient with history of bipolar, anxiety, depression presents feeling "emotionally stressed out" and feels that her klonopin is not working.  Her xanax was recently changed to klonopin and she wonders if that is the problem.  Denies suicidal or homicial ideation.  Admit to intermittent discomfort in her central chest that lasts a few minutes at a time that she attributes to anxiety.  Pain lasts a couple minutes, comes on at rest, and does not radiate.  No associated SOB, nausea, vomiting.  No cardiac history. Patient does not think she has ever had a stress test.  The history is provided by the patient.    Past Medical History  Diagnosis Date  . Hypertension   . Active smoker   . Hx of hernia repair   . Bipolar 1 disorder   . Depression   . Psychosis   . Paranoid delusion    Past Surgical History  Procedure Laterality Date  . Cholecystectomy    . Hernia repair      4   . Ankle surgery      2 right ankle  . Tonsillectomy    . Tubal ligation     No family history on file. History  Substance Use Topics  . Smoking status: Current Every Day Smoker -- 0.50 packs/day    Types: Cigarettes  . Smokeless tobacco: Not on file  . Alcohol Use: Yes     Comment: occasionally   OB History   Grav Para Term Preterm Abortions TAB SAB Ect Mult Living                 Review of Systems  Constitutional: Negative for fever, activity change and appetite change.  Respiratory: Positive for chest tightness. Negative for cough and shortness of breath.   Cardiovascular: Positive for chest pain.  Gastrointestinal: Negative for nausea, vomiting and abdominal pain.  Genitourinary: Negative for dysuria.   Musculoskeletal: Negative for back pain.  Skin: Negative for rash.  Neurological: Negative for dizziness, weakness and headaches.  Psychiatric/Behavioral: Positive for decreased concentration. Negative for suicidal ideas and agitation. The patient is nervous/anxious.   A complete 10 system review of systems was obtained and all systems are negative except as noted in the HPI and PMH.    Allergies  Depakote and Wellbutrin  Home Medications   Current Outpatient Rx  Name  Route  Sig  Dispense  Refill  . albuterol (PROVENTIL HFA;VENTOLIN HFA) 108 (90 BASE) MCG/ACT inhaler   Inhalation   Inhale 2 puffs into the lungs every 6 (six) hours as needed for wheezing or shortness of breath. Related to Asthma.         . clonazePAM (KLONOPIN) 0.5 MG tablet   Oral   Take 0.5 mg by mouth 2 (two) times daily as needed for anxiety.         Marland Kitchen lithium carbonate (ESKALITH) 450 MG CR tablet   Oral   Take 450 mg by mouth 2 (two) times daily.         Marland Kitchen topiramate (TOPAMAX) 50 MG tablet   Oral   Take 50 mg by mouth 2 (two) times daily.         . traZODone (DESYREL) 50 MG tablet   Oral  Take 50 mg by mouth at bedtime.          BP 121/69  Pulse 84  Temp(Src) 98.5 F (36.9 C)  Resp 20  Ht 5' (1.524 m)  Wt 265 lb (120.203 kg)  BMI 51.75 kg/m2  SpO2 100% Physical Exam  Constitutional: She is oriented to person, place, and time. She appears well-developed and well-nourished. No distress.  HENT:  Head: Normocephalic and atraumatic.  Mouth/Throat: Oropharynx is clear and moist. No oropharyngeal exudate.  Eyes: Conjunctivae and EOM are normal. Pupils are equal, round, and reactive to light.  Neck: Normal range of motion. Neck supple.  Cardiovascular: Normal rate, regular rhythm and normal heart sounds.   No murmur heard. Pulmonary/Chest: Effort normal and breath sounds normal. No respiratory distress. She exhibits tenderness.  Reproducible chest wall tenderness  Abdominal: Soft.  There is no tenderness. There is no rebound and no guarding.  Musculoskeletal: Normal range of motion. She exhibits no edema and no tenderness.  Neurological: She is alert and oriented to person, place, and time. No cranial nerve deficit. Coordination normal.  Skin: Skin is warm.    ED Course  Procedures (including critical care time) Labs Review Labs Reviewed  CBC WITH DIFFERENTIAL - Abnormal; Notable for the following:    WBC 16.4 (*)    Neutro Abs 10.2 (*)    Lymphs Abs 5.0 (*)    All other components within normal limits  COMPREHENSIVE METABOLIC PANEL - Abnormal; Notable for the following:    Potassium 3.3 (*)    Glucose, Bld 115 (*)    Total Bilirubin 0.2 (*)    GFR calc non Af Amer 79 (*)    All other components within normal limits  LITHIUM LEVEL - Abnormal; Notable for the following:    Lithium Lvl 0.59 (*)    All other components within normal limits  TROPONIN I  URINALYSIS, ROUTINE W REFLEX MICROSCOPIC  PREGNANCY, URINE  TROPONIN I   Imaging Review Dg Chest 2 View  03/12/2013   CLINICAL DATA:  Chest pain, anxiety  EXAM: CHEST  2 VIEW  COMPARISON:  01/27/2013; 12/03/2012; chest CT -11/06/2010.  FINDINGS: grossly unchanged cardiac silhouette and mediastinal contours. Evaluation of the retrosternal clear space obscured secondary to overlying soft tissues. No focal airspace opacities. No pleural effusion or pneumothorax. No evidence of edema. Unchanged bones.  IMPRESSION: No acute cardiopulmonary disease.   Electronically Signed   By: Simonne Come M.D.   On: 03/12/2013 20:55    EKG Interpretation     Ventricular Rate:  91 PR Interval:  190 QRS Duration: 90 QT Interval:  362 QTC Calculation: 445 R Axis:   82 Text Interpretation:  Normal sinus rhythm Abnormal ECG            MDM   1. Anxiety    Anxiety with intermittent chest tightness, feeling "emotionally stressed".  No SI or HI. No chest pain currently. Chest pain description is atypical for ACS or PE.   Pain is reproducible on exam.  EKG is abnormal, but unchanged. Leukocytosis as previously.  Troponin negative.  Patient feels better after receiving ativan in the ED. Encouraged to discuss her klonopin regimen with her psychiatrist and increase to 1 mg BID as needed. She adamantly denies suicidal or homicidal thoughts.      Glynn Octave, MD 03/12/13 [phone]

## 2013-03-12 NOTE — ED Notes (Signed)
States she wants a refill for xanax, states her meds have been changed recently, denies being suicidal

## 2013-03-14 ENCOUNTER — Emergency Department (HOSPITAL_COMMUNITY)
Admission: EM | Admit: 2013-03-14 | Discharge: 2013-03-14 | Disposition: A | Discharge status: Home / Self Care | Payer: Medicaid Other | Attending: Emergency Medicine | Admitting: Emergency Medicine

## 2013-03-14 ENCOUNTER — Encounter (HOSPITAL_COMMUNITY): Payer: Self-pay | Admitting: Emergency Medicine

## 2013-03-14 ENCOUNTER — Emergency Department (HOSPITAL_COMMUNITY): Payer: Medicaid Other

## 2013-03-14 DIAGNOSIS — I10 Essential (primary) hypertension: Secondary | ICD-10-CM | POA: Insufficient documentation

## 2013-03-14 DIAGNOSIS — F3289 Other specified depressive episodes: Secondary | ICD-10-CM | POA: Insufficient documentation

## 2013-03-14 DIAGNOSIS — F329 Major depressive disorder, single episode, unspecified: Secondary | ICD-10-CM | POA: Insufficient documentation

## 2013-03-14 DIAGNOSIS — Z7982 Long term (current) use of aspirin: Secondary | ICD-10-CM | POA: Insufficient documentation

## 2013-03-14 DIAGNOSIS — F419 Anxiety disorder, unspecified: Secondary | ICD-10-CM

## 2013-03-14 DIAGNOSIS — R079 Chest pain, unspecified: Secondary | ICD-10-CM

## 2013-03-14 DIAGNOSIS — Z8659 Personal history of other mental and behavioral disorders: Secondary | ICD-10-CM | POA: Insufficient documentation

## 2013-03-14 DIAGNOSIS — F411 Generalized anxiety disorder: Secondary | ICD-10-CM | POA: Insufficient documentation

## 2013-03-14 DIAGNOSIS — Z79899 Other long term (current) drug therapy: Secondary | ICD-10-CM | POA: Insufficient documentation

## 2013-03-14 DIAGNOSIS — F17200 Nicotine dependence, unspecified, uncomplicated: Secondary | ICD-10-CM | POA: Insufficient documentation

## 2013-03-14 DIAGNOSIS — F319 Bipolar disorder, unspecified: Secondary | ICD-10-CM | POA: Insufficient documentation

## 2013-03-14 HISTORY — DX: Anxiety disorder, unspecified: F41.9

## 2013-03-14 LAB — BASIC METABOLIC PANEL
BUN: 7 mg/dL (ref 6–23)
CO2: 22 mEq/L (ref 19–32)
Calcium: 9.3 mg/dL (ref 8.4–10.5)
GFR calc Af Amer: >90 mL/min (ref >90)
GFR calc non Af Amer: 85 mL/min — ABNORMAL LOW (ref >90)
Glucose, Bld: 90 mg/dL (ref 70–99)
Sodium: 140 mEq/L (ref 135–145)

## 2013-03-14 LAB — CBC WITH DIFFERENTIAL/PLATELET
Basophils Relative: 1 % (ref 0–1)
Eosinophils Absolute: 0.4 10*3/uL (ref 0.0–0.7)
Eosinophils Relative: 3 % (ref 0–5)
HCT: 37.7 % (ref 36.0–46.0)
Lymphs Abs: 4.6 10*3/uL — ABNORMAL HIGH (ref 0.7–4.0)
MCH: 26.8 pg (ref 26.0–34.0)
MCHC: 32.6 g/dL (ref 30.0–36.0)
MCV: 82.1 fL (ref 78.0–100.0)
Monocytes Absolute: 0.8 10*3/uL (ref 0.1–1.0)
Monocytes Relative: 6 % (ref 3–12)
Neutrophils Relative %: 58 % (ref 43–77)
Platelets: 283 10*3/uL (ref 150–400)
RBC: 4.59 MIL/uL (ref 3.87–5.11)

## 2013-03-14 LAB — RAPID URINE DRUG SCREEN, HOSP PERFORMED
Barbiturates: NOT DETECTED
Cocaine: NOT DETECTED

## 2013-03-14 LAB — POCT I-STAT TROPONIN I: Troponin i, poc: 0 ng/mL (ref 0.00–0.08)

## 2013-03-14 LAB — D-DIMER, QUANTITATIVE: D-Dimer, Quant: 0.38 ug/mL-FEU (ref 0.00–0.48)

## 2013-03-14 MED ORDER — LORAZEPAM 1 MG PO TABS
1.0000 mg | ORAL_TABLET | Freq: Once | ORAL | Status: AC
  Administered 2013-03-14: 1 mg via ORAL
  Filled 2013-03-14: qty 1

## 2013-03-14 MED ORDER — MORPHINE SULFATE 4 MG/ML IJ SOLN
4.0000 mg | Freq: Once | INTRAMUSCULAR | Status: DC
  Filled 2013-03-14: qty 1

## 2013-03-14 NOTE — ED Provider Notes (Signed)
CSN: [id number]     Arrival date & time 03/14/13  1857 History   First MD Initiated Contact with Patient 03/14/13 1913     Chief Complaint  Patient presents with  . Chest Pain   (Consider location/radiation/quality/duration/timing/severity/associated sxs/prior Treatment) HPI Comments: Patient presents emergency department with chief complaint of chest pain. She states that the pain has been ongoing for the past week or so. She states that it is intermittent. She states that sometimes radiates to right shoulder, but is mostly localized in the Center for chest. She states that it is worsened with deep breathing. She states that she has had palpitations for the past week, and was seen previously in the emergency department 2 days ago. She states that she has felt very stressed out. She has had panic attacks before, but states this feels different. She was given aspirin and one nitroglycerin by EMS.  The history is provided by the patient. No language interpreter was used.    Past Medical History  Diagnosis Date  . Hypertension   . Active smoker   . Hx of hernia repair   . Bipolar 1 disorder   . Depression   . Psychosis   . Paranoid delusion   . Anxiety    Past Surgical History  Procedure Laterality Date  . Cholecystectomy    . Hernia repair      4   . Ankle surgery      2 right ankle  . Tonsillectomy    . Tubal ligation     No family history on file. History  Substance Use Topics  . Smoking status: Current Every Day Smoker -- 0.50 packs/day    Types: Cigarettes  . Smokeless tobacco: Not on file  . Alcohol Use: Yes     Comment: occasionally   OB History   Grav Para Term Preterm Abortions TAB SAB Ect Mult Living                 Review of Systems  All other systems reviewed and are negative.    Allergies  Depakote and Wellbutrin  Home Medications   Current Outpatient Rx  Name  Route  Sig  Dispense  Refill  . albuterol (PROVENTIL HFA;VENTOLIN HFA) 108 (90 BASE)  MCG/ACT inhaler   Inhalation   Inhale 2 puffs into the lungs every 6 (six) hours as needed for wheezing or shortness of breath. Related to Asthma.         Marland Kitchen aspirin EC 325 MG tablet   Oral   Take 325 mg by mouth once.         . clonazePAM (KLONOPIN) 0.5 MG tablet   Oral   Take 0.5 mg by mouth 2 (two) times daily as needed for anxiety.         . hydrochlorothiazide (HYDRODIURIL) 12.5 MG tablet   Oral   Take 12.5 mg by mouth daily.         . hydrOXYzine (ATARAX/VISTARIL) 50 MG tablet   Oral   Take 50 mg by mouth 3 (three) times daily as needed.         Marland Kitchen lisinopril (PRINIVIL,ZESTRIL) 5 MG tablet   Oral   Take 5 mg by mouth daily.         Marland Kitchen lithium carbonate (ESKALITH) 450 MG CR tablet   Oral   Take 450 mg by mouth 2 (two) times daily.         Marland Kitchen topiramate (TOPAMAX) 50 MG tablet   Oral  Take 50 mg by mouth 2 (two) times daily.         . traZODone (DESYREL) 50 MG tablet   Oral   Take 50 mg by mouth at bedtime.          BP 109/67  Temp(Src) 98.1 F (36.7 C) (Oral)  Resp 18  SpO2 100% Physical Exam  Nursing note and vitals reviewed. Constitutional: She is oriented to person, place, and time. She appears well-developed and well-nourished.  HENT:  Head: Normocephalic and atraumatic.  Eyes: Conjunctivae and EOM are normal. Pupils are equal, round, and reactive to light.  Neck: Normal range of motion. Neck supple.  Cardiovascular: Normal rate and regular rhythm.  Exam reveals no gallop and no friction rub.   No murmur heard. Pulmonary/Chest: Effort normal and breath sounds normal. No respiratory distress. She has no wheezes. She has no rales. She exhibits no tenderness.  Abdominal: Soft. Bowel sounds are normal. She exhibits no distension and no mass. There is no tenderness. There is no rebound and no guarding.  Musculoskeletal: Normal range of motion. She exhibits no edema and no tenderness.  Neurological: She is alert and oriented to person, place, and  time.  Skin: Skin is warm and dry.  Psychiatric:  Anxious appearing    ED Course  Procedures (including critical care time) Labs Review Labs Reviewed  CBC WITH DIFFERENTIAL  BASIC METABOLIC PANEL  URINE RAPID DRUG SCREEN (HOSP PERFORMED)   Imaging Review Dg Chest 2 View  03/12/2013   CLINICAL DATA:  Chest pain, anxiety  EXAM: CHEST  2 VIEW  COMPARISON:  01/27/2013; 12/03/2012; chest CT -11/06/2010.  FINDINGS: grossly unchanged cardiac silhouette and mediastinal contours. Evaluation of the retrosternal clear space obscured secondary to overlying soft tissues. No focal airspace opacities. No pleural effusion or pneumothorax. No evidence of edema. Unchanged bones.  IMPRESSION: No acute cardiopulmonary disease.   Electronically Signed   By: Simonne Come M.D.   On: 03/12/2013 20:55    EKG Interpretation    Date/Time:  Monday March 14 2013 19:30:39 EST Ventricular Rate:  85 PR Interval:  174 QRS Duration: 93 QT Interval:  388 QTC Calculation: 461 R Axis:   86 Text Interpretation:  Sinus rhythm RSR' in V1 or V2, right VCD or RVH Borderline T abnormalities, inferior leads No significant change since last tracing            MDM   1. Anxiety   2. Chest pain     Patient with chest pain which is been intermittent for the past week. She was seen 2 days ago for similar symptoms, and was discharged. She has an extensive psychiatric history, including anxiety attacks. She states that this feels different. Check basic labs, and will reevaluate.  Patient discussed with Dr. Karma Ganja, who agrees with plan.  D-dimer is negative.  Labs are reassuring. HEART score is 1.  Suspect that this is the patient's anxiety. Recommend follow-up with psychiatrist.  Ceasar Mons Vitals:   03/14/13 1921  BP: 109/67  Temp: 98.1 F (36.7 C)  Resp: 18    Patient tolerating oral intake. She states that she feels better than when she came in. Will discharge to home. Patient understands and agrees with the  plan. She is stable and ready for discharge.   Roxy Horseman, PA-C 03/14/13 2237

## 2013-03-14 NOTE — ED Notes (Addendum)
C/o SSCP radiating into right shoulder which worsens with deep breaths, mvmt, palpation worsening over past week. Per EMS - given ASA 324mg , NTG x1 with some relief.

## 2013-03-14 NOTE — ED Provider Notes (Signed)
Medical screening examination/treatment/procedure(s) were performed by non-physician practitioner and as supervising physician I was immediately available for consultation/collaboration.  EKG Interpretation    Date/Time:  Monday March 14 2013 19:30:39 EST Ventricular Rate:  85 PR Interval:  174 QRS Duration: 93 QT Interval:  388 QTC Calculation: 461 R Axis:   86 Text Interpretation:  Sinus rhythm RSR' in V1 or V2, right VCD or RVH Borderline T abnormalities, inferior leads No significant change since last tracing             Ethelda Chick, MD 03/14/13 2237

## 2013-03-14 NOTE — ED Notes (Signed)
Pt reports that the pain in her chest has not decreased. Alyssa Prince, [place] is aware.

## 2013-03-22 ENCOUNTER — Emergency Department (HOSPITAL_COMMUNITY)
Admission: EM | Admit: 2013-03-22 | Discharge: 2013-03-22 | Disposition: A | Discharge status: Home / Self Care | Payer: Medicaid Other | Attending: Emergency Medicine | Admitting: Emergency Medicine

## 2013-03-22 ENCOUNTER — Encounter (HOSPITAL_COMMUNITY): Payer: Self-pay | Admitting: Emergency Medicine

## 2013-03-22 DIAGNOSIS — F29 Unspecified psychosis not due to a substance or known physiological condition: Secondary | ICD-10-CM | POA: Insufficient documentation

## 2013-03-22 DIAGNOSIS — F410 Panic disorder [episodic paroxysmal anxiety] without agoraphobia: Secondary | ICD-10-CM | POA: Insufficient documentation

## 2013-03-22 DIAGNOSIS — Z8719 Personal history of other diseases of the digestive system: Secondary | ICD-10-CM | POA: Insufficient documentation

## 2013-03-22 DIAGNOSIS — R9431 Abnormal electrocardiogram [ECG] [EKG]: Secondary | ICD-10-CM | POA: Insufficient documentation

## 2013-03-22 DIAGNOSIS — Z79899 Other long term (current) drug therapy: Secondary | ICD-10-CM | POA: Insufficient documentation

## 2013-03-22 DIAGNOSIS — F319 Bipolar disorder, unspecified: Secondary | ICD-10-CM | POA: Insufficient documentation

## 2013-03-22 DIAGNOSIS — F411 Generalized anxiety disorder: Secondary | ICD-10-CM | POA: Insufficient documentation

## 2013-03-22 DIAGNOSIS — F17200 Nicotine dependence, unspecified, uncomplicated: Secondary | ICD-10-CM | POA: Insufficient documentation

## 2013-03-22 DIAGNOSIS — F419 Anxiety disorder, unspecified: Secondary | ICD-10-CM

## 2013-03-22 DIAGNOSIS — Z888 Allergy status to other drugs, medicaments and biological substances status: Secondary | ICD-10-CM | POA: Insufficient documentation

## 2013-03-22 DIAGNOSIS — Z7982 Long term (current) use of aspirin: Secondary | ICD-10-CM | POA: Insufficient documentation

## 2013-03-22 DIAGNOSIS — I10 Essential (primary) hypertension: Secondary | ICD-10-CM | POA: Insufficient documentation

## 2013-03-22 LAB — POCT I-STAT TROPONIN I: Troponin i, poc: 0 ng/mL (ref 0.00–0.08)

## 2013-03-22 MED ORDER — LORAZEPAM 1 MG PO TABS: 1.0000 mg | ORAL_TABLET | Freq: Four times a day (QID) | ORAL | Status: DC | PRN

## 2013-03-22 NOTE — ED Provider Notes (Signed)
CSN: [id number]     Arrival date & time 03/22/13  1615 History   First MD Initiated Contact with Patient 03/22/13 1736     Chief Complaint  Patient presents with  . Chest Pain  . Anxiety   (Consider location/radiation/quality/duration/timing/severity/associated sxs/prior Treatment) HPI Comments: Patient is a 42 year old female with history of bipolar and anxiety. She presents here with complaints of feeling very anxious and having a lot of stressors in her home life. She states that she has bad neighbors and is having issues with panic attacks due to this. She states that she is on Klonopin however this is not working. She states Ativan has helped her in the past and she is requesting this. She reports some tightness in her chest however denies shortness of breath, radiation to the arm or jaw, or diaphoresis.  Patient is a 42 y.o. female presenting with chest pain and anxiety. The history is provided by the patient.  Chest Pain Pain location:  Substernal area Pain quality: tightness   Pain radiates to:  Does not radiate Pain radiates to the back: no   Pain severity:  Moderate Onset quality:  Sudden Timing:  Constant Progression:  Worsening Chronicity:  Chronic Associated symptoms: anxiety   Anxiety Associated symptoms include chest pain.    Past Medical History  Diagnosis Date  . Hypertension   . Active smoker   . Hx of hernia repair   . Bipolar 1 disorder   . Depression   . Psychosis   . Paranoid delusion   . Anxiety    Past Surgical History  Procedure Laterality Date  . Cholecystectomy    . Hernia repair      4   . Ankle surgery      2 right ankle  . Tonsillectomy    . Tubal ligation     No family history on file. History  Substance Use Topics  . Smoking status: Current Every Day Smoker -- 0.50 packs/day    Types: Cigarettes  . Smokeless tobacco: Not on file  . Alcohol Use: Yes     Comment: occasionally   OB History   Grav Para Term Preterm Abortions TAB  SAB Ect Mult Living                 Review of Systems  Cardiovascular: Positive for chest pain.  All other systems reviewed and are negative.    Allergies  Depakote and Wellbutrin  Home Medications   Current Outpatient Rx  Name  Route  Sig  Dispense  Refill  . albuterol (PROVENTIL HFA;VENTOLIN HFA) 108 (90 BASE) MCG/ACT inhaler   Inhalation   Inhale 2 puffs into the lungs every 6 (six) hours as needed for wheezing or shortness of breath. Related to Asthma.         Marland Kitchen aspirin EC 325 MG tablet   Oral   Take 325 mg by mouth daily.          . clonazePAM (KLONOPIN) 0.5 MG tablet   Oral   Take 0.5 mg by mouth 2 (two) times daily as needed for anxiety.         . hydrochlorothiazide (HYDRODIURIL) 12.5 MG tablet   Oral   Take 12.5 mg by mouth daily.         . hydrOXYzine (ATARAX/VISTARIL) 50 MG tablet   Oral   Take 50 mg by mouth 3 (three) times daily as needed for anxiety.          Marland Kitchen lisinopril (PRINIVIL,ZESTRIL)  5 MG tablet   Oral   Take 5 mg by mouth daily.         Marland Kitchen lithium carbonate (ESKALITH) 450 MG CR tablet   Oral   Take 450 mg by mouth 2 (two) times daily.         Marland Kitchen topiramate (TOPAMAX) 50 MG tablet   Oral   Take 50 mg by mouth 2 (two) times daily.         . traZODone (DESYREL) 50 MG tablet   Oral   Take 50 mg by mouth at bedtime.          BP 118/68  Pulse 75  Temp(Src) 98.1 F (36.7 C) (Oral)  Ht 5' (1.524 m)  Wt 273 lb 12.8 oz (124.195 kg)  BMI 53.47 kg/m2  SpO2 100% Physical Exam  Nursing note and vitals reviewed. Constitutional: She is oriented to person, place, and time. She appears well-developed and well-nourished. No distress.  HENT:  Head: Normocephalic and atraumatic.  Neck: Normal range of motion. Neck supple.  Cardiovascular: Normal rate and regular rhythm.  Exam reveals no gallop and no friction rub.   No murmur heard. Pulmonary/Chest: Effort normal and breath sounds normal. No respiratory distress. She has no  wheezes.  Abdominal: Soft. Bowel sounds are normal. She exhibits no distension. There is no tenderness.  Musculoskeletal: Normal range of motion.  Neurological: She is alert and oriented to person, place, and time.  Skin: Skin is warm and dry. She is not diaphoretic.  Psychiatric: Judgment and thought content normal. Her mood appears anxious. Her speech is rapid and/or pressured. She is not actively hallucinating and not combative. Cognition and memory are normal.    ED Course  Procedures (including critical care time) Labs Review Labs Reviewed  POCT I-STAT TROPONIN I   Imaging Review No results found.  EKG Interpretation    Date/Time:  Tuesday March 22 2013 16:22:30 EST Ventricular Rate:  87 PR Interval:  148 QRS Duration: 80 QT Interval:  376 QTC Calculation: 452 R Axis:   86 Text Interpretation:  Normal sinus rhythm with sinus arrhythmia Nonspecific T wave abnormality Abnormal ECG Confirmed by DELOS  MD, Salome Hautala ([id number]) on 03/22/2013 6:04:49 PM            MDM  No diagnosis found. Patient presents with complaints of tightness in the chest and feeling very anxious. She states that this is her anxiety. She has no prior cardiac history and her EKG is unremarkable. He is requesting something to help her relaxants. She tells me Ativan as work in the past and I have agreed to give her a small number of these to get her through until she can followup with her provider. She is to return if she develops any new or bothersome symptoms. I have extremely low suspicion of any cardiac etiology do not feel as though further workup is indicated into this.    Geoffery Lyons, MD 03/22/13 [phone]

## 2013-03-22 NOTE — ED Notes (Signed)
Pt states that she feels better and wants to go home. States that she will take her klonopin when she gets home. Informed EDP.

## 2013-03-22 NOTE — ED Notes (Signed)
Pt states that she is having chest pain secondary to anxiety. States that klonopin is not helping her anxiety. States that anxiety has gotten so bad that she cannot leave her house and that she feels like she is about to have a panic attack "all the time".

## 2013-03-22 NOTE — ED Notes (Signed)
Pt states that for over a week she has been having "a lot of difficulties" she lives in a area where she doesn't feel safe.  She feels afraid and doesn't feel that she is safe in her apartments and feels very anxious and get chest pain when its time to go home.  Pt states that she has been calling EMS and when they came out they say its not cardiac and that her symptoms are due to anxiety.  Pt states that the symptoms occur when she thinks about the problems at home.

## 2013-03-25 ENCOUNTER — Encounter (HOSPITAL_COMMUNITY): Payer: Self-pay | Admitting: Emergency Medicine

## 2013-03-25 ENCOUNTER — Emergency Department (HOSPITAL_COMMUNITY): Payer: Medicaid Other

## 2013-03-25 ENCOUNTER — Emergency Department (HOSPITAL_COMMUNITY)
Admission: EM | Admit: 2013-03-25 | Discharge: 2013-03-25 | Disposition: A | Discharge status: Home / Self Care | Payer: Medicaid Other | Attending: Emergency Medicine | Admitting: Emergency Medicine

## 2013-03-25 DIAGNOSIS — F411 Generalized anxiety disorder: Secondary | ICD-10-CM | POA: Insufficient documentation

## 2013-03-25 DIAGNOSIS — R6883 Chills (without fever): Secondary | ICD-10-CM | POA: Insufficient documentation

## 2013-03-25 DIAGNOSIS — R5381 Other malaise: Secondary | ICD-10-CM | POA: Insufficient documentation

## 2013-03-25 DIAGNOSIS — R0789 Other chest pain: Secondary | ICD-10-CM | POA: Insufficient documentation

## 2013-03-25 DIAGNOSIS — R209 Unspecified disturbances of skin sensation: Secondary | ICD-10-CM | POA: Insufficient documentation

## 2013-03-25 DIAGNOSIS — I10 Essential (primary) hypertension: Secondary | ICD-10-CM | POA: Insufficient documentation

## 2013-03-25 DIAGNOSIS — R51 Headache: Secondary | ICD-10-CM | POA: Insufficient documentation

## 2013-03-25 DIAGNOSIS — F17200 Nicotine dependence, unspecified, uncomplicated: Secondary | ICD-10-CM | POA: Insufficient documentation

## 2013-03-25 DIAGNOSIS — Z3202 Encounter for pregnancy test, result negative: Secondary | ICD-10-CM | POA: Insufficient documentation

## 2013-03-25 DIAGNOSIS — IMO0001 Reserved for inherently not codable concepts without codable children: Secondary | ICD-10-CM | POA: Insufficient documentation

## 2013-03-25 DIAGNOSIS — Z7982 Long term (current) use of aspirin: Secondary | ICD-10-CM | POA: Insufficient documentation

## 2013-03-25 DIAGNOSIS — R630 Anorexia: Secondary | ICD-10-CM | POA: Insufficient documentation

## 2013-03-25 DIAGNOSIS — R0602 Shortness of breath: Secondary | ICD-10-CM | POA: Insufficient documentation

## 2013-03-25 DIAGNOSIS — Z9889 Other specified postprocedural states: Secondary | ICD-10-CM | POA: Insufficient documentation

## 2013-03-25 DIAGNOSIS — F319 Bipolar disorder, unspecified: Secondary | ICD-10-CM | POA: Insufficient documentation

## 2013-03-25 DIAGNOSIS — R531 Weakness: Secondary | ICD-10-CM

## 2013-03-25 DIAGNOSIS — Z79899 Other long term (current) drug therapy: Secondary | ICD-10-CM | POA: Insufficient documentation

## 2013-03-25 LAB — POCT I-STAT, CHEM 8
Calcium, Ion: 1.24 mmol/L — ABNORMAL HIGH (ref 1.12–1.23)
Creatinine, Ser: 1 mg/dL (ref 0.50–1.10)
Glucose, Bld: 83 mg/dL (ref 70–99)
HCT: 42 % (ref 36.0–46.0)
Hemoglobin: 14.3 g/dL (ref 12.0–15.0)
Potassium: 4.1 mEq/L (ref 3.5–5.1)
TCO2: 23 mmol/L (ref 0–100)

## 2013-03-25 LAB — URINE MICROSCOPIC-ADD ON

## 2013-03-25 LAB — CBC WITH DIFFERENTIAL/PLATELET
Basophils Absolute: 0 10*3/uL (ref 0.0–0.1)
Eosinophils Absolute: 0.3 10*3/uL (ref 0.0–0.7)
Lymphocytes Relative: 28 % (ref 12–46)
MCH: 26.7 pg (ref 26.0–34.0)
MCHC: 32.4 g/dL (ref 30.0–36.0)
Monocytes Absolute: 0.7 10*3/uL (ref 0.1–1.0)
Monocytes Relative: 5 % (ref 3–12)
Neutrophils Relative %: 65 % (ref 43–77)
Platelets: UNDETERMINED 10*3/uL (ref 150–400)
RDW: 14.9 % (ref 11.5–15.5)

## 2013-03-25 LAB — URINALYSIS, ROUTINE W REFLEX MICROSCOPIC
Glucose, UA: NEGATIVE mg/dL
Ketones, ur: NEGATIVE mg/dL
Leukocytes, UA: NEGATIVE
Protein, ur: NEGATIVE mg/dL
Urobilinogen, UA: 0.2 mg/dL (ref 0.0–1.0)

## 2013-03-25 LAB — POCT PREGNANCY, URINE: Preg Test, Ur: NEGATIVE

## 2013-03-25 LAB — POCT I-STAT TROPONIN I

## 2013-03-25 MED ORDER — LORAZEPAM 1 MG PO TABS
1.0000 mg | ORAL_TABLET | Freq: Once | ORAL | Status: AC
  Administered 2013-03-25: 1 mg via ORAL
  Filled 2013-03-25: qty 1

## 2013-03-25 NOTE — ED Notes (Signed)
Patient with anxiety, having some shortness of breath and not being able to get enough air.  Patient has calmed down since coming in with EMS.  Patient having multiple anxiety issues.

## 2013-03-25 NOTE — ED Provider Notes (Signed)
CSN: [id number]     Arrival date & time 03/25/13  0645 History   First MD Initiated Contact with Patient 03/25/13 [id number]     No chief complaint on file.  (Consider location/radiation/quality/duration/timing/severity/associated sxs/prior Treatment) HPI Comments: Patient is here with multiple complaints including generalized weakness, numbness and tingling around lips and finger tips, decrease in appetite, decrease in wanting to go out from home, decreased appetite, chest tightness, shortness of breath, chills.  She denies fever but reports a generalized headache, denies blurred vision, difficulty swallowing or speaking, reports generalized anterior chest wall pain without radiation of the pain, shortness of breath without cough or congestion, denies abdominal pain, nausea or vomiting, denies constipation or diarrhea, blood in stool, syncope.  The history is provided by the patient. No language interpreter was used.    Past Medical History  Diagnosis Date  . Hypertension   . Active smoker   . Hx of hernia repair   . Bipolar 1 disorder   . Depression   . Psychosis   . Paranoid delusion   . Anxiety    Past Surgical History  Procedure Laterality Date  . Cholecystectomy    . Hernia repair      4   . Ankle surgery      2 right ankle  . Tonsillectomy    . Tubal ligation     No family history on file. History  Substance Use Topics  . Smoking status: Current Every Day Smoker -- 0.50 packs/day    Types: Cigarettes  . Smokeless tobacco: Not on file  . Alcohol Use: Yes     Comment: occasionally   OB History   Grav Para Term Preterm Abortions TAB SAB Ect Mult Living                 Review of Systems  Constitutional: Positive for chills, activity change, appetite change and fatigue.  HENT: Negative for congestion.   Eyes: Negative for photophobia and pain.  Respiratory: Positive for chest tightness and shortness of breath.   Cardiovascular: Positive for chest pain.   Gastrointestinal: Negative for nausea, vomiting, abdominal pain, diarrhea, constipation and blood in stool.  Endocrine: Negative for polydipsia.  Genitourinary: Negative for dysuria and flank pain.  Musculoskeletal: Positive for myalgias. Negative for back pain.  Skin: Negative for rash.  Neurological: Positive for headaches. Negative for dizziness.    Allergies  Depakote and Wellbutrin  Home Medications   Current Outpatient Rx  Name  Route  Sig  Dispense  Refill  . albuterol (PROVENTIL HFA;VENTOLIN HFA) 108 (90 BASE) MCG/ACT inhaler   Inhalation   Inhale 2 puffs into the lungs every 6 (six) hours as needed for wheezing or shortness of breath. Related to Asthma.         Marland Kitchen aspirin EC 325 MG tablet   Oral   Take 325 mg by mouth daily.          . clonazePAM (KLONOPIN) 0.5 MG tablet   Oral   Take 0.5 mg by mouth 2 (two) times daily as needed for anxiety.         . hydrochlorothiazide (HYDRODIURIL) 12.5 MG tablet   Oral   Take 12.5 mg by mouth daily.         . hydrOXYzine (ATARAX/VISTARIL) 50 MG tablet   Oral   Take 50 mg by mouth 3 (three) times daily as needed for anxiety.          Marland Kitchen lisinopril (PRINIVIL,ZESTRIL) 5 MG tablet  Oral   Take 5 mg by mouth daily.         Marland Kitchen lithium carbonate (ESKALITH) 450 MG CR tablet   Oral   Take 450 mg by mouth 2 (two) times daily.         Marland Kitchen LORazepam (ATIVAN) 1 MG tablet   Oral   Take 1 tablet (1 mg total) by mouth every 6 (six) hours as needed for anxiety.   12 tablet   0   . topiramate (TOPAMAX) 50 MG tablet   Oral   Take 50 mg by mouth 2 (two) times daily.         . traZODone (DESYREL) 50 MG tablet   Oral   Take 50 mg by mouth at bedtime.          BP 123/104  Pulse 81  Temp(Src) 97.8 F (36.6 C) (Oral)  Resp 22  Wt 274 lb (124.286 kg)  SpO2 100% Physical Exam  Nursing note and vitals reviewed. Constitutional: She is oriented to person, place, and time. She appears well-developed and  well-nourished.  Anxious appearing  HENT:  Head: Normocephalic and atraumatic.  Right Ear: External ear normal.  Left Ear: External ear normal.  Nose: Nose normal.  Mouth/Throat: Oropharynx is clear and moist. No oropharyngeal exudate.  Eyes: Conjunctivae are normal. Pupils are equal, round, and reactive to light. No scleral icterus.  Neck: Normal range of motion. Neck supple.  Cardiovascular: Normal rate, regular rhythm and normal heart sounds.  Exam reveals no gallop and no friction rub.   No murmur heard. Pulmonary/Chest: Effort normal and breath sounds normal. No respiratory distress. She has no wheezes. She has no rales. She exhibits no tenderness.  Abdominal: Soft. Bowel sounds are normal. She exhibits no distension. There is no tenderness.  Musculoskeletal: Normal range of motion. She exhibits no edema and no tenderness.  Lymphadenopathy:    She has no cervical adenopathy.  Neurological: She is alert and oriented to person, place, and time. She exhibits normal muscle tone. Coordination normal.  Skin: Skin is warm and dry. No rash noted. No erythema. No pallor.  Psychiatric: She has a normal mood and affect. Her behavior is normal. Judgment and thought content normal.    ED Course  Procedures (including critical care time) Labs Review Labs Reviewed  CBC WITH DIFFERENTIAL  URINALYSIS, ROUTINE W REFLEX MICROSCOPIC   Imaging Review No results found.  EKG Interpretation   None      Results for orders placed during the hospital encounter of 03/25/13  CBC WITH DIFFERENTIAL      Result Value Range   WBC 13.0 (*) 4.0 - 10.5 K/uL   RBC 4.75  3.87 - 5.11 MIL/uL   Hemoglobin 12.7  12.0 - 15.0 g/dL   HCT 09.8  11.9 - 14.7 %   MCV 82.5  78.0 - 100.0 fL   MCH 26.7  26.0 - 34.0 pg   MCHC 32.4  30.0 - 36.0 g/dL   RDW 82.9  56.2 - 13.0 %   Platelets PLATELET CLUMPS NOTED ON SMEAR, UNABLE TO ESTIMATE  150 - 400 K/uL   Neutrophils Relative % 65  43 - 77 %   Lymphocytes Relative  28  12 - 46 %   Monocytes Relative 5  3 - 12 %   Eosinophils Relative 2  0 - 5 %   Basophils Relative 0  0 - 1 %   Neutro Abs 8.4 (*) 1.7 - 7.7 K/uL   Lymphs Abs 3.6  0.7 - 4.0 K/uL   Monocytes Absolute 0.7  0.1 - 1.0 K/uL   Eosinophils Absolute 0.3  0.0 - 0.7 K/uL   Basophils Absolute 0.0  0.0 - 0.1 K/uL   WBC Morphology ATYPICAL LYMPHOCYTES    URINALYSIS, ROUTINE W REFLEX MICROSCOPIC      Result Value Range   Color, Urine YELLOW  YELLOW   APPearance CLEAR  CLEAR   Specific Gravity, Urine 1.008  1.005 - 1.030   pH 7.0  5.0 - 8.0   Glucose, UA NEGATIVE  NEGATIVE mg/dL   Hgb urine dipstick SMALL (*) NEGATIVE   Bilirubin Urine NEGATIVE  NEGATIVE   Ketones, ur NEGATIVE  NEGATIVE mg/dL   Protein, ur NEGATIVE  NEGATIVE mg/dL   Urobilinogen, UA 0.2  0.0 - 1.0 mg/dL   Nitrite NEGATIVE  NEGATIVE   Leukocytes, UA NEGATIVE  NEGATIVE  URINE MICROSCOPIC-ADD ON      Result Value Range   Squamous Epithelial / LPF RARE  RARE   WBC, UA 0-2  <3 WBC/hpf   RBC / HPF 0-2  <3 RBC/hpf   Bacteria, UA RARE  RARE   Urine-Other MUCOUS PRESENT    POCT PREGNANCY, URINE      Result Value Range   Preg Test, Ur NEGATIVE  NEGATIVE  POCT I-STAT, CHEM 8      Result Value Range   Sodium 141  135 - 145 mEq/L   Potassium 4.1  3.5 - 5.1 mEq/L   Chloride 109  96 - 112 mEq/L   BUN 7  6 - 23 mg/dL   Creatinine, Ser 1.61  0.50 - 1.10 mg/dL   Glucose, Bld 83  70 - 99 mg/dL   Calcium, Ion 0.96 (*) 1.12 - 1.23 mmol/L   TCO2 23  0 - 100 mmol/L   Hemoglobin 14.3  12.0 - 15.0 g/dL   HCT 04.5  40.9 - 81.1 %  POCT I-STAT TROPONIN I      Result Value Range   Troponin i, poc 0.01  0.00 - 0.08 ng/mL   Comment 3            Dg Chest 2 View  03/25/2013   CLINICAL DATA:  Shortness of breath and chest pain.  EXAM: CHEST  2 VIEW  COMPARISON:  Multiple priors  FINDINGS: The cardiomediastinal silhouette is normal. No focal infiltrate or edema. No pleural effusion or pneumothorax. No acute osseous abnormality.  IMPRESSION:  No active cardiopulmonary disease.   Electronically Signed   By: Jerene Dilling M.D.   On: 03/25/2013 07:31   Dg Chest 2 View  03/12/2013   CLINICAL DATA:  Chest pain, anxiety  EXAM: CHEST  2 VIEW  COMPARISON:  01/27/2013; 12/03/2012; chest CT -11/06/2010.  FINDINGS: grossly unchanged cardiac silhouette and mediastinal contours. Evaluation of the retrosternal clear space obscured secondary to overlying soft tissues. No focal airspace opacities. No pleural effusion or pneumothorax. No evidence of edema. Unchanged bones.  IMPRESSION: No acute cardiopulmonary disease.   Electronically Signed   By: Simonne Come M.D.   On: 03/12/2013 20:55   Dg Chest Portable 1 View  03/14/2013   CLINICAL DATA:  Chest pain and shortness of Breath.  EXAM: PORTABLE CHEST - 1 VIEW  COMPARISON:  03/12/2013.  FINDINGS: The heart size and mediastinal contours are within normal limits. Both lungs are clear. The visualized skeletal structures are unremarkable.  IMPRESSION: No acute cardiopulmonary findings.   Electronically Signed   By: Loralie Champagne M.D.   On: 03/14/2013  20:46     Date: 03/25/2013  Rate: 71  Rhythm: sinus  QRS Axis: RVH, 92  Intervals: PR 168, QT 466, QTc 507  ST/T Wave abnormalities: Inferior 1 waves, borderline t was abn lateral leads  Conduction Disutrbances:borderline prolonged QT  Narrative Interpretation: No stemi, no significant changes since last, Reviewed by Dr. Rosalia Hammers  Old EKG Reviewed: 03/22/2013   MDM  Generalized weakness  Patient here with multiple complaints without focal emphasis.  Doubt CVA as weakness is generalized, no functional deficits noted on clinical examination, Hbg and Hct normal here, no electolyte abnormalities, CXR normal.  Patient without emergency medical condition based on clinical examination. EKG without acute changes, troponin negative.  Doubt cardiac.  This is likely exacerbation of mental illness, patient denies suicidal or homicidal ideation, I have offered [facility]  evaluation but she refuses.  Will follow up with PCP on weakness, fatigue and shortness of breath issues.   Izola Price Marisue Humble, PA-C 03/25/13 1005

## 2013-03-25 NOTE — ED Notes (Signed)
Pt states her whole face feels "chalky." pt noticed slurred speech x 1 week. No other neuro deficits noted. No acute distress noted at this time.

## 2013-03-27 NOTE — ED Provider Notes (Signed)
History/physical exam/procedure(s) were performed by non-physician practitioner and as supervising physician I was immediately available for consultation/collaboration. I have reviewed all notes and am in agreement with care and plan.   Hilario Quarry, MD 03/27/13 [phone]

## 2013-04-14 ENCOUNTER — Emergency Department (HOSPITAL_COMMUNITY)
Admission: EM | Admit: 2013-04-14 | Discharge: 2013-04-14 | Disposition: A | Discharge status: Home / Self Care | Payer: Medicaid Other | Attending: Emergency Medicine | Admitting: Emergency Medicine

## 2013-04-14 ENCOUNTER — Emergency Department (HOSPITAL_COMMUNITY): Payer: Medicaid Other

## 2013-04-14 ENCOUNTER — Other Ambulatory Visit: Payer: Self-pay

## 2013-04-14 DIAGNOSIS — F17200 Nicotine dependence, unspecified, uncomplicated: Secondary | ICD-10-CM | POA: Insufficient documentation

## 2013-04-14 DIAGNOSIS — Z7982 Long term (current) use of aspirin: Secondary | ICD-10-CM | POA: Insufficient documentation

## 2013-04-14 DIAGNOSIS — IMO0002 Reserved for concepts with insufficient information to code with codable children: Secondary | ICD-10-CM | POA: Insufficient documentation

## 2013-04-14 DIAGNOSIS — F410 Panic disorder [episodic paroxysmal anxiety] without agoraphobia: Secondary | ICD-10-CM | POA: Insufficient documentation

## 2013-04-14 DIAGNOSIS — I10 Essential (primary) hypertension: Secondary | ICD-10-CM | POA: Insufficient documentation

## 2013-04-14 DIAGNOSIS — F419 Anxiety disorder, unspecified: Secondary | ICD-10-CM

## 2013-04-14 DIAGNOSIS — Z79899 Other long term (current) drug therapy: Secondary | ICD-10-CM | POA: Insufficient documentation

## 2013-04-14 DIAGNOSIS — Z9889 Other specified postprocedural states: Secondary | ICD-10-CM | POA: Insufficient documentation

## 2013-04-14 DIAGNOSIS — F319 Bipolar disorder, unspecified: Secondary | ICD-10-CM | POA: Insufficient documentation

## 2013-04-14 LAB — CBC WITH DIFFERENTIAL/PLATELET
Basophils Relative: 0 % (ref 0–1)
HCT: 35.6 % — ABNORMAL LOW (ref 36.0–46.0)
Hemoglobin: 11.5 g/dL — ABNORMAL LOW (ref 12.0–15.0)
Lymphocytes Relative: 24 % (ref 12–46)
Lymphs Abs: 4.1 10*3/uL — ABNORMAL HIGH (ref 0.7–4.0)
MCHC: 32.3 g/dL (ref 30.0–36.0)
Monocytes Absolute: 1.1 10*3/uL — ABNORMAL HIGH (ref 0.1–1.0)
Monocytes Relative: 7 % (ref 3–12)
Neutro Abs: 11.3 10*3/uL — ABNORMAL HIGH (ref 1.7–7.7)
Neutrophils Relative %: 66 % (ref 43–77)
Platelets: 312 10*3/uL (ref 150–400)
RBC: 4.31 MIL/uL (ref 3.87–5.11)
RDW: 14.6 % (ref 11.5–15.5)
WBC: 17 10*3/uL — ABNORMAL HIGH (ref 4.0–10.5)

## 2013-04-14 LAB — BASIC METABOLIC PANEL
BUN: 9 mg/dL (ref 6–23)
CO2: 23 mEq/L (ref 19–32)
Chloride: 107 mEq/L (ref 96–112)
Creatinine, Ser: 0.86 mg/dL (ref 0.50–1.10)
GFR calc Af Amer: >90 mL/min (ref >90)
Potassium: 3.5 mEq/L (ref 3.5–5.1)
Sodium: 138 mEq/L (ref 135–145)

## 2013-04-14 LAB — TROPONIN I: Troponin I: <0.3 ng/mL (ref <0.30)

## 2013-04-14 MED ORDER — LORAZEPAM 1 MG PO TABS: ORAL_TABLET | ORAL | Status: AC

## 2013-04-14 MED ORDER — LORAZEPAM 2 MG/ML IJ SOLN
1.0000 mg | Freq: Once | INTRAMUSCULAR | Status: AC
  Administered 2013-04-14: 1 mg via INTRAVENOUS
  Filled 2013-04-14: qty 1

## 2013-04-14 NOTE — ED Provider Notes (Signed)
CSN: [id number]     Arrival date & time 04/14/13  0230 History   First MD Initiated Contact with Patient 04/14/13 0244     Chief Complaint  Patient presents with  . Panic Attack    ran out of ativan 2 days ago   . Chest Pain   (Consider location/radiation/quality/duration/timing/severity/associated sxs/prior Treatment) Patient is a 42 y.o. female presenting with chest pain. The history is provided by the patient (pt states she has been nervous and ran out of her ativan.  she aslo has some chest pain).  Chest Pain Pain location:  Substernal area Pain quality: aching   Pain radiates to:  Does not radiate Pain radiates to the back: no   Pain severity:  Mild Onset quality:  Sudden Timing:  Intermittent Progression:  Unchanged Chronicity:  Recurrent Context: not breathing   Associated symptoms: no abdominal pain, no back pain, no cough, no fatigue and no headache     Past Medical History  Diagnosis Date  . Hypertension   . Active smoker   . Hx of hernia repair   . Bipolar 1 disorder   . Depression   . Psychosis   . Paranoid delusion   . Anxiety    Past Surgical History  Procedure Laterality Date  . Cholecystectomy    . Hernia repair      4   . Ankle surgery      2 right ankle  . Tonsillectomy    . Tubal ligation     No family history on file. History  Substance Use Topics  . Smoking status: Current Every Day Smoker -- 0.50 packs/day    Types: Cigarettes  . Smokeless tobacco: Not on file  . Alcohol Use: Yes     Comment: occasionally   OB History   Grav Para Term Preterm Abortions TAB SAB Ect Mult Living                 Review of Systems  Constitutional: Negative for appetite change and fatigue.  HENT: Negative for congestion, ear discharge and sinus pressure.   Eyes: Negative for discharge.  Respiratory: Negative for cough.   Cardiovascular: Positive for chest pain.  Gastrointestinal: Negative for abdominal pain and diarrhea.  Genitourinary: Negative for  frequency and hematuria.  Musculoskeletal: Negative for back pain.  Skin: Negative for rash.  Neurological: Negative for seizures and headaches.  Psychiatric/Behavioral: Positive for agitation. Negative for hallucinations.    Allergies  Depakote and Wellbutrin  Home Medications   Current Outpatient Rx  Name  Route  Sig  Dispense  Refill  . albuterol (PROVENTIL HFA;VENTOLIN HFA) 108 (90 BASE) MCG/ACT inhaler   Inhalation   Inhale 2 puffs into the lungs every 6 (six) hours as needed for wheezing or shortness of breath. Related to Asthma.         Marland Kitchen aspirin EC 325 MG tablet   Oral   Take 325 mg by mouth daily.          . clonazePAM (KLONOPIN) 0.5 MG tablet   Oral   Take 0.5 mg by mouth 2 (two) times daily as needed for anxiety.         . Cyanocobalamin (VITAMIN B-12) 1000 MCG SUBL   Sublingual   Place 1 each under the tongue daily.         . hydrochlorothiazide (HYDRODIURIL) 12.5 MG tablet   Oral   Take 12.5 mg by mouth daily.         Marland Kitchen lisinopril (PRINIVIL,ZESTRIL)  5 MG tablet   Oral   Take 5 mg by mouth every other day.          . lithium carbonate (ESKALITH) 450 MG CR tablet   Oral   Take 450 mg by mouth 2 (two) times daily.         Marland Kitchen LORazepam (ATIVAN) 1 MG tablet   Oral   Take 1 tablet (1 mg total) by mouth every 6 (six) hours as needed for anxiety.   12 tablet   0   . topiramate (TOPAMAX) 50 MG tablet   Oral   Take 50 mg by mouth 2 (two) times daily.         . traZODone (DESYREL) 50 MG tablet   Oral   Take 50 mg by mouth at bedtime.          BP 134/71  Pulse 83  Temp(Src) 98.2 F (36.8 C) (Oral)  Resp 24  Ht 5\' 1"  (1.549 m)  Wt 267 lb (121.11 kg)  BMI 50.48 kg/m2  SpO2 100%  LMP 04/07/2013 Physical Exam  Constitutional: She is oriented to person, place, and time. She appears well-developed.  HENT:  Head: Normocephalic.  Eyes: Conjunctivae and EOM are normal. No scleral icterus.  Neck: Neck supple. No thyromegaly present.   Cardiovascular: Normal rate and regular rhythm.  Exam reveals no gallop and no friction rub.   No murmur heard. Pulmonary/Chest: No stridor. She has no wheezes. She has no rales. She exhibits no tenderness.  Abdominal: She exhibits no distension. There is no tenderness. There is no rebound.  Musculoskeletal: Normal range of motion. She exhibits no edema.  Lymphadenopathy:    She has no cervical adenopathy.  Neurological: She is oriented to person, place, and time. She exhibits normal muscle tone. Coordination normal.  Skin: No rash noted. No erythema.  Psychiatric:  Anxious,  Not suicidal    ED Course  Procedures (including critical care time) Labs Review Labs Reviewed  CBC WITH DIFFERENTIAL - Abnormal; Notable for the following:    WBC 17.0 (*)    Hemoglobin 11.5 (*)    HCT 35.6 (*)    Neutro Abs 11.3 (*)    Lymphs Abs 4.1 (*)    Monocytes Absolute 1.1 (*)    All other components within normal limits  BASIC METABOLIC PANEL - Abnormal; Notable for the following:    GFR calc non Af Amer 83 (*)    All other components within normal limits  TROPONIN I   Imaging Review Dg Chest 2 View  04/14/2013   CLINICAL DATA:  Panic attack  EXAM: CHEST  2 VIEW  COMPARISON:  05/25/2012  FINDINGS: The heart size and mediastinal contours are within normal limits. Both lungs are clear. The visualized skeletal structures are unremarkable.  IMPRESSION: No active cardiopulmonary disease.   Electronically Signed   By: Tiburcio Pea M.D.   On: 04/14/2013 05:01    EKG Interpretation   None       MDM      Benny Lennert, MD 04/14/13 [phone]

## 2013-04-14 NOTE — ED Notes (Signed)
Patient states she took a medication that begins with a B but unable to recall the meds

## 2013-04-15 ENCOUNTER — Encounter (HOSPITAL_COMMUNITY): Payer: Self-pay | Admitting: Emergency Medicine

## 2013-04-15 ENCOUNTER — Emergency Department (HOSPITAL_COMMUNITY): Payer: Medicaid Other

## 2013-04-15 ENCOUNTER — Emergency Department (HOSPITAL_COMMUNITY)
Admission: EM | Admit: 2013-04-15 | Discharge: 2013-04-15 | Disposition: A | Discharge status: Home / Self Care | Payer: Medicaid Other | Attending: Emergency Medicine | Admitting: Emergency Medicine

## 2013-04-15 DIAGNOSIS — I10 Essential (primary) hypertension: Secondary | ICD-10-CM | POA: Insufficient documentation

## 2013-04-15 DIAGNOSIS — F411 Generalized anxiety disorder: Secondary | ICD-10-CM | POA: Insufficient documentation

## 2013-04-15 DIAGNOSIS — Z7982 Long term (current) use of aspirin: Secondary | ICD-10-CM | POA: Insufficient documentation

## 2013-04-15 DIAGNOSIS — R079 Chest pain, unspecified: Secondary | ICD-10-CM | POA: Insufficient documentation

## 2013-04-15 DIAGNOSIS — Z9889 Other specified postprocedural states: Secondary | ICD-10-CM | POA: Insufficient documentation

## 2013-04-15 DIAGNOSIS — R059 Cough, unspecified: Secondary | ICD-10-CM | POA: Insufficient documentation

## 2013-04-15 DIAGNOSIS — F17200 Nicotine dependence, unspecified, uncomplicated: Secondary | ICD-10-CM | POA: Insufficient documentation

## 2013-04-15 DIAGNOSIS — Z79899 Other long term (current) drug therapy: Secondary | ICD-10-CM | POA: Insufficient documentation

## 2013-04-15 DIAGNOSIS — E669 Obesity, unspecified: Secondary | ICD-10-CM | POA: Insufficient documentation

## 2013-04-15 DIAGNOSIS — F3130 Bipolar disorder, current episode depressed, mild or moderate severity, unspecified: Secondary | ICD-10-CM | POA: Insufficient documentation

## 2013-04-15 DIAGNOSIS — R05 Cough: Secondary | ICD-10-CM | POA: Insufficient documentation

## 2013-04-15 LAB — CBC WITH DIFFERENTIAL/PLATELET
Basophils Relative: 0 % (ref 0–1)
Eosinophils Absolute: 0.4 10*3/uL (ref 0.0–0.7)
Eosinophils Relative: 2 % (ref 0–5)
HCT: 38.7 % (ref 36.0–46.0)
Hemoglobin: 12.6 g/dL (ref 12.0–15.0)
Lymphocytes Relative: 31 % (ref 12–46)
MCHC: 32.6 g/dL (ref 30.0–36.0)
MCV: 82.5 fL (ref 78.0–100.0)
Monocytes Absolute: 0.9 10*3/uL (ref 0.1–1.0)
Monocytes Relative: 5 % (ref 3–12)
Neutro Abs: 11.3 10*3/uL — ABNORMAL HIGH (ref 1.7–7.7)
Neutrophils Relative %: 62 % (ref 43–77)
WBC: 18.3 10*3/uL — ABNORMAL HIGH (ref 4.0–10.5)

## 2013-04-15 LAB — BASIC METABOLIC PANEL
BUN: 10 mg/dL (ref 6–23)
CO2: 23 mEq/L (ref 19–32)
Chloride: 104 mEq/L (ref 96–112)
GFR calc Af Amer: >90 mL/min (ref >90)
Potassium: 3.5 mEq/L (ref 3.5–5.1)
Sodium: 140 mEq/L (ref 135–145)

## 2013-04-15 MED ORDER — LORAZEPAM 1 MG PO TABS
1.0000 mg | ORAL_TABLET | Freq: Once | ORAL | Status: AC
  Administered 2013-04-15: 1 mg via ORAL
  Filled 2013-04-15: qty 1

## 2013-04-15 NOTE — ED Provider Notes (Signed)
CSN: [id number]     Arrival date & time 04/15/13  1457 History   First MD Initiated Contact with Patient 04/15/13 1549     Chief Complaint  Patient presents with  . Cough  . Chest Pain   (Consider location/radiation/quality/duration/timing/severity/associated sxs/prior Treatment) HPI.... sharp anterior chest pain without radiation intermittently for 2 days. No associated dyspnea, diaphoresis, nausea. Patient has history of anxiety and bipolar illness.   Nothing makes symptoms better or worse. Severity is mild. She states lots of family stress at this time.  Past Medical History  Diagnosis Date  . Hypertension   . Active smoker   . Hx of hernia repair   . Bipolar 1 disorder   . Depression   . Psychosis   . Paranoid delusion   . Anxiety    Past Surgical History  Procedure Laterality Date  . Cholecystectomy    . Hernia repair      4   . Ankle surgery      2 right ankle  . Tonsillectomy    . Tubal ligation     No family history on file. History  Substance Use Topics  . Smoking status: Current Every Day Smoker -- 0.50 packs/day    Types: Cigarettes  . Smokeless tobacco: Not on file  . Alcohol Use: Yes     Comment: occasionally   OB History   Grav Para Term Preterm Abortions TAB SAB Ect Mult Living                 Review of Systems  All other systems reviewed and are negative.    Allergies  Depakote and Wellbutrin  Home Medications   Current Outpatient Rx  Name  Route  Sig  Dispense  Refill  . aspirin EC 325 MG tablet   Oral   Take 325 mg by mouth daily.          . clonazePAM (KLONOPIN) 0.5 MG tablet   Oral   Take 0.5 mg by mouth 2 (two) times daily as needed for anxiety.         . hydrochlorothiazide (HYDRODIURIL) 12.5 MG tablet   Oral   Take 12.5 mg by mouth daily.         Marland Kitchen lisinopril (PRINIVIL,ZESTRIL) 5 MG tablet   Oral   Take 5 mg by mouth every other day.          . lithium carbonate (ESKALITH) 450 MG CR tablet   Oral   Take 450 mg  by mouth 2 (two) times daily.         Marland Kitchen LORazepam (ATIVAN) 1 MG tablet      Take one pill every 12 hours as needed for anxiety   30 tablet   0   . topiramate (TOPAMAX) 50 MG tablet   Oral   Take 50 mg by mouth 2 (two) times daily.         Marland Kitchen albuterol (PROVENTIL HFA;VENTOLIN HFA) 108 (90 BASE) MCG/ACT inhaler   Inhalation   Inhale 2 puffs into the lungs every 6 (six) hours as needed for wheezing or shortness of breath. Related to Asthma.          BP 116/41  Pulse 81  Temp(Src) 98.2 F (36.8 C) (Oral)  Resp 20  Ht 5' (1.524 m)  Wt 267 lb (121.11 kg)  BMI 52.14 kg/m2  SpO2 100%  LMP 04/07/2013 Physical Exam  Nursing note and vitals reviewed. Constitutional: She is oriented to person, place, and time.  obese  HENT:  Head: Normocephalic and atraumatic.  Eyes: Conjunctivae and EOM are normal. Pupils are equal, round, and reactive to light.  Neck: Normal range of motion. Neck supple.  Cardiovascular: Normal rate, regular rhythm and normal heart sounds.   Pulmonary/Chest: Effort normal and breath sounds normal.  Abdominal: Soft. Bowel sounds are normal.  Musculoskeletal: Normal range of motion.  Neurological: She is alert and oriented to person, place, and time.  Skin: Skin is warm and dry.  Psychiatric: She has a normal mood and affect. Her behavior is normal.    ED Course  Procedures (including critical care time) Labs Review Labs Reviewed  BASIC METABOLIC PANEL - Abnormal; Notable for the following:    Glucose, Bld 119 (*)    GFR calc non Af Amer 86 (*)    All other components within normal limits  CBC WITH DIFFERENTIAL - Abnormal; Notable for the following:    WBC 18.3 (*)    Neutro Abs 11.3 (*)    Lymphs Abs 5.7 (*)    All other components within normal limits  TROPONIN I   Imaging Review Dg Chest 2 View  04/15/2013   CLINICAL DATA:  42 year old female with chest pain  EXAM: CHEST  2 VIEW  COMPARISON:  04/14/2013  FINDINGS: The cardiomediastinal  silhouette is unremarkable.  The lungs are clear.  There is no evidence of focal airspace disease, pulmonary edema, suspicious pulmonary nodule/mass, pleural effusion, or pneumothorax. No acute bony abnormalities are identified.  IMPRESSION: No active cardiopulmonary disease.   Electronically Signed   By: Laveda Abbe M.D.   On: 04/15/2013 18:05   Dg Chest 2 View  04/14/2013   CLINICAL DATA:  Panic attack  EXAM: CHEST  2 VIEW  COMPARISON:  05/25/2012  FINDINGS: The heart size and mediastinal contours are within normal limits. Both lungs are clear. The visualized skeletal structures are unremarkable.  IMPRESSION: No active cardiopulmonary disease.   Electronically Signed   By: Tiburcio Pea M.D.   On: 04/14/2013 05:01    EKG Interpretation    Date/Time:  Friday April 15 2013 15:25:35 EST Ventricular Rate:  70 PR Interval:  162 QRS Duration: 86 QT Interval:  402 QTC Calculation: 434 R Axis:   79 Text Interpretation:  Normal sinus rhythm Nonspecific ST and T wave abnormality Abnormal ECG When compared with ECG of 14-Apr-2013 02:27, No significant change was found Confirmed by Sharen Youngren  MD, Shanicqua Coldren ([id number]) on 04/15/2013 4:44:53 PM            MDM   1. Chest pain    Screening EKG, labs troponin all normal. Chest x-ray shows no pneumonia. Elevated white count noted. EKG shows a pulse of 70.   Patient feels better after Ativan    Donnetta Hutching, MD 04/15/13 2118

## 2013-04-15 NOTE — ED Notes (Signed)
Pt has been sick w/ cough and congestion for unknown amount of time, has been seen and treated in the ed for the same, now reports 2 days of cp that comes and goes,  Is trying to get her anxiety meds changed and just started this new med for 2-3 weeks. Pt having family issues and her stress level is up.

## 2015-05-03 IMAGING — CR DG CHEST 2V
2 series · 2 of 2 positions shown · non-contrast
Comparison: 01/27/2013; 12/03/2012; chest CT -11/06/2010.

CLINICAL DATA: Chest pain, anxiety

EXAM:
CHEST  2 VIEW

[view not recorded (1 of 2)]
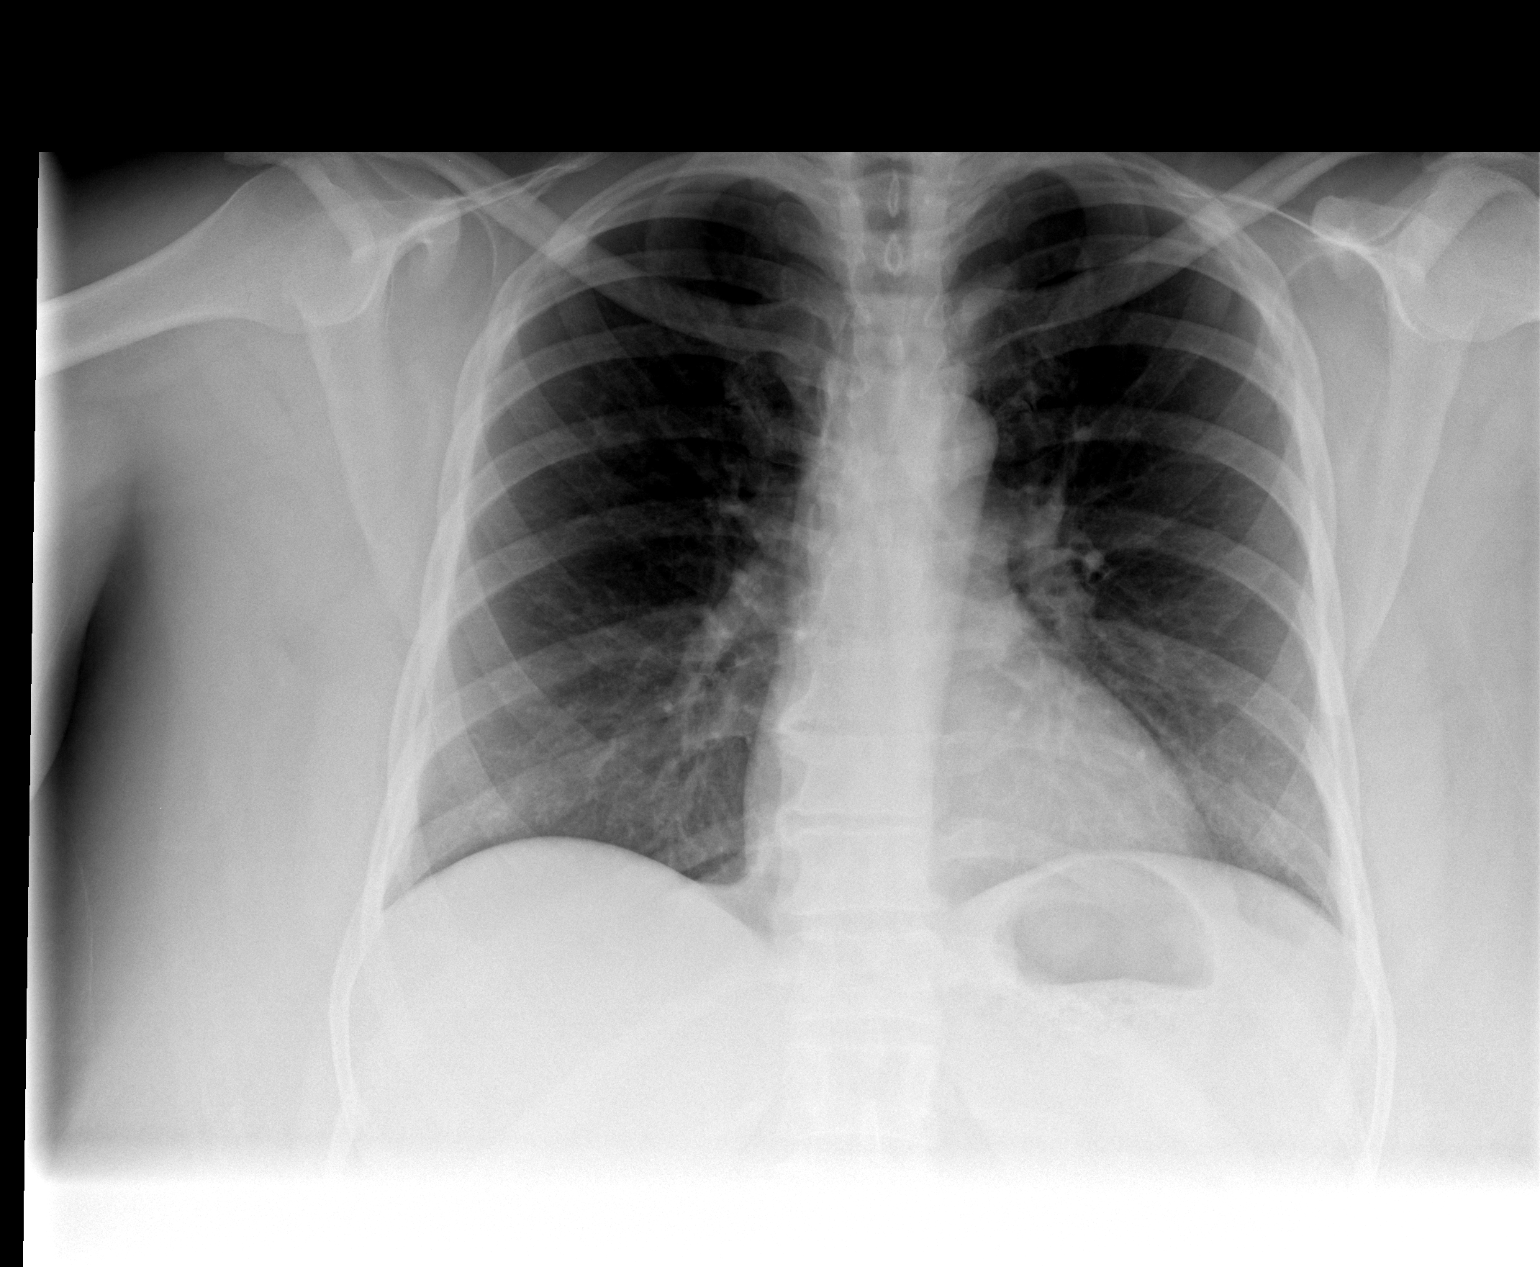

[view not recorded (2 of 2)]
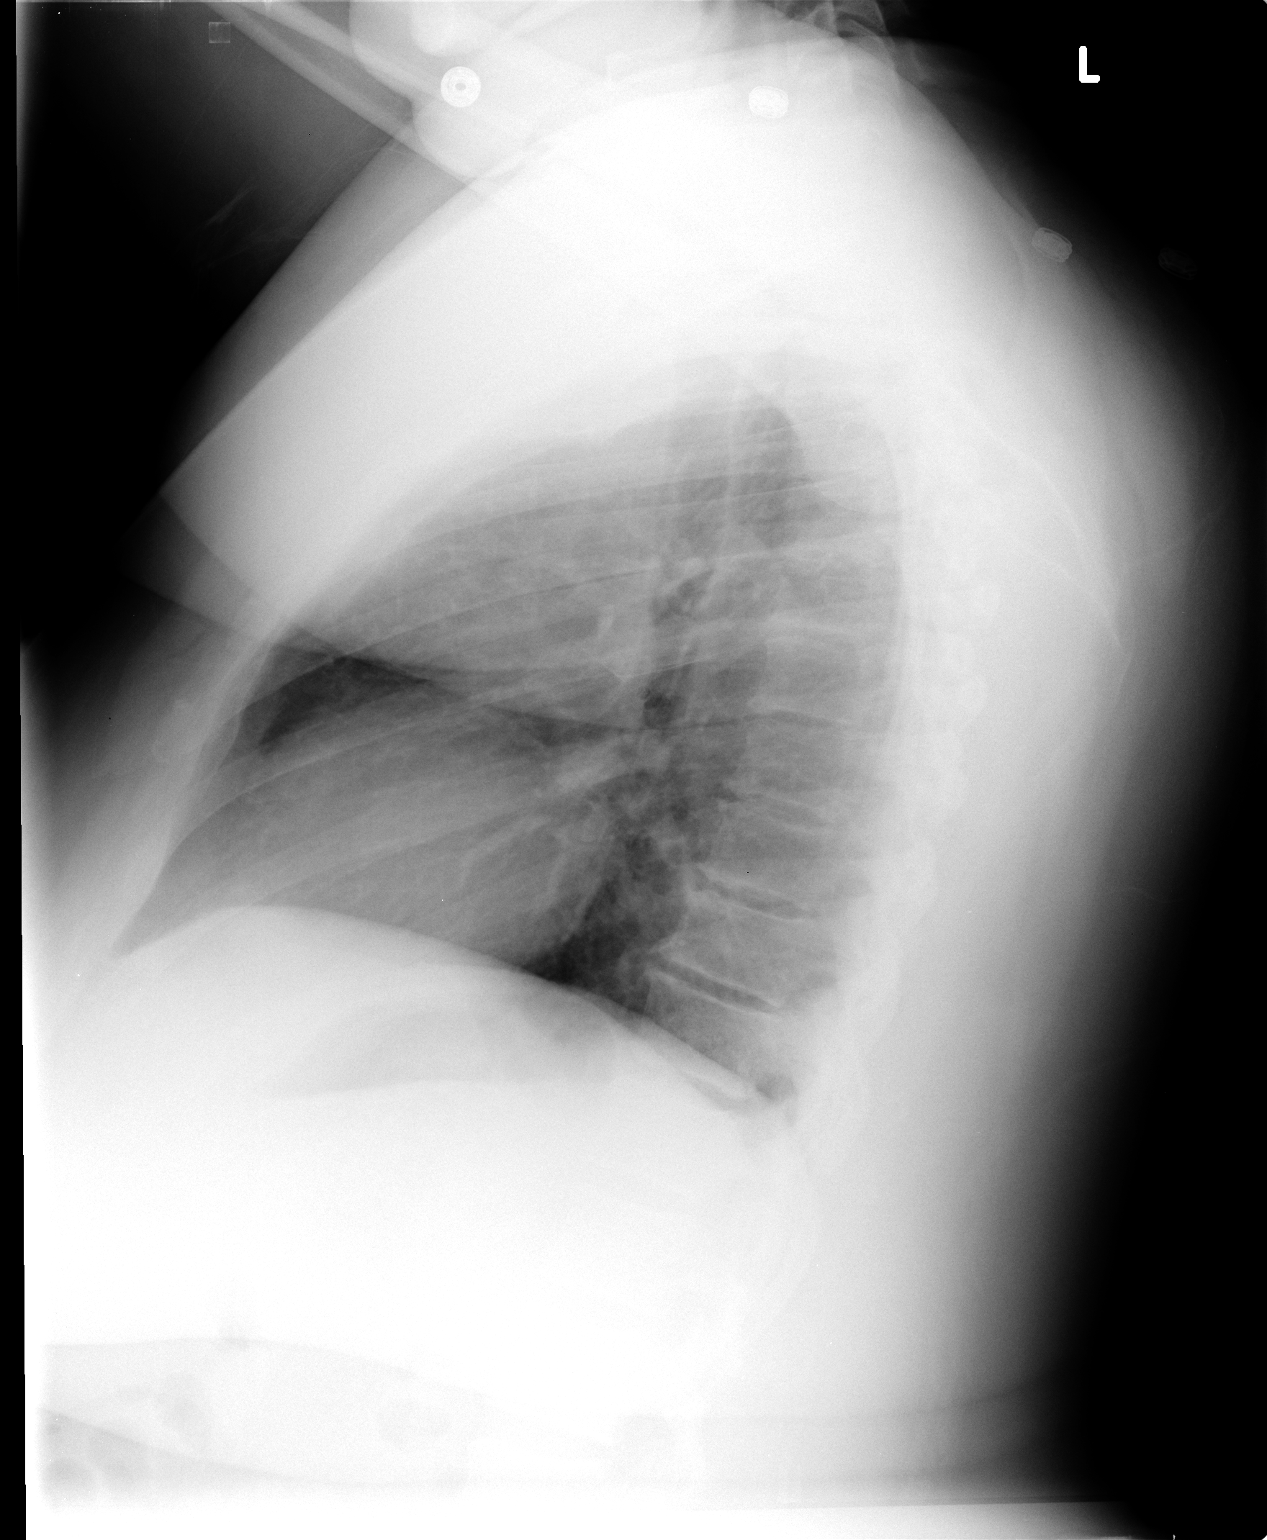

[2 of 2 positions shown; findings below may reference images not displayed]

FINDINGS: grossly unchanged cardiac silhouette and mediastinal contours.
Evaluation of the retrosternal clear space obscured secondary to
overlying soft tissues. No focal airspace opacities. No pleural
effusion or pneumothorax. No evidence of edema. Unchanged bones.
IMPRESSION: No acute cardiopulmonary disease.

## 2015-05-16 IMAGING — CR DG CHEST 2V
2 series · 2 of 2 positions shown · non-contrast
Comparison: Multiple priors

CLINICAL DATA: Shortness of breath and chest pain.

EXAM:
CHEST  2 VIEW

[w chest pa]
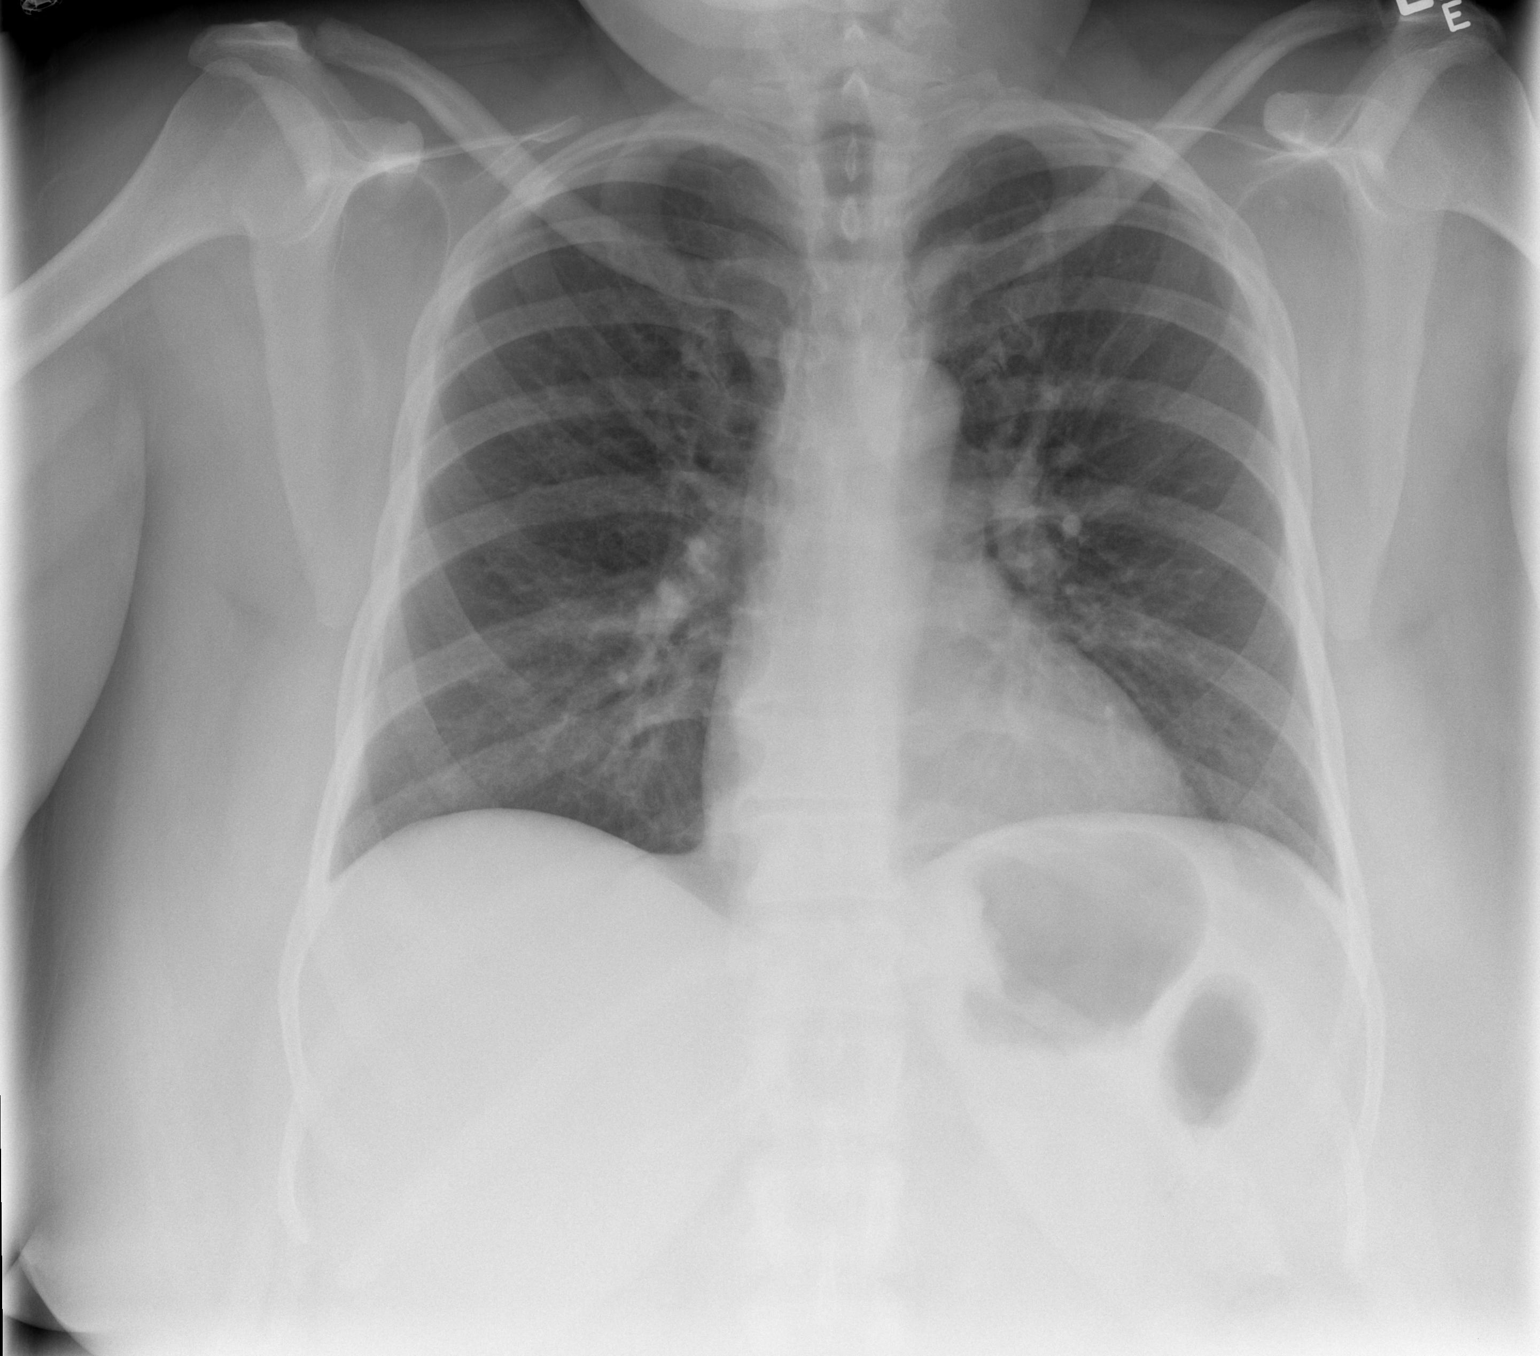

[w chest lat]
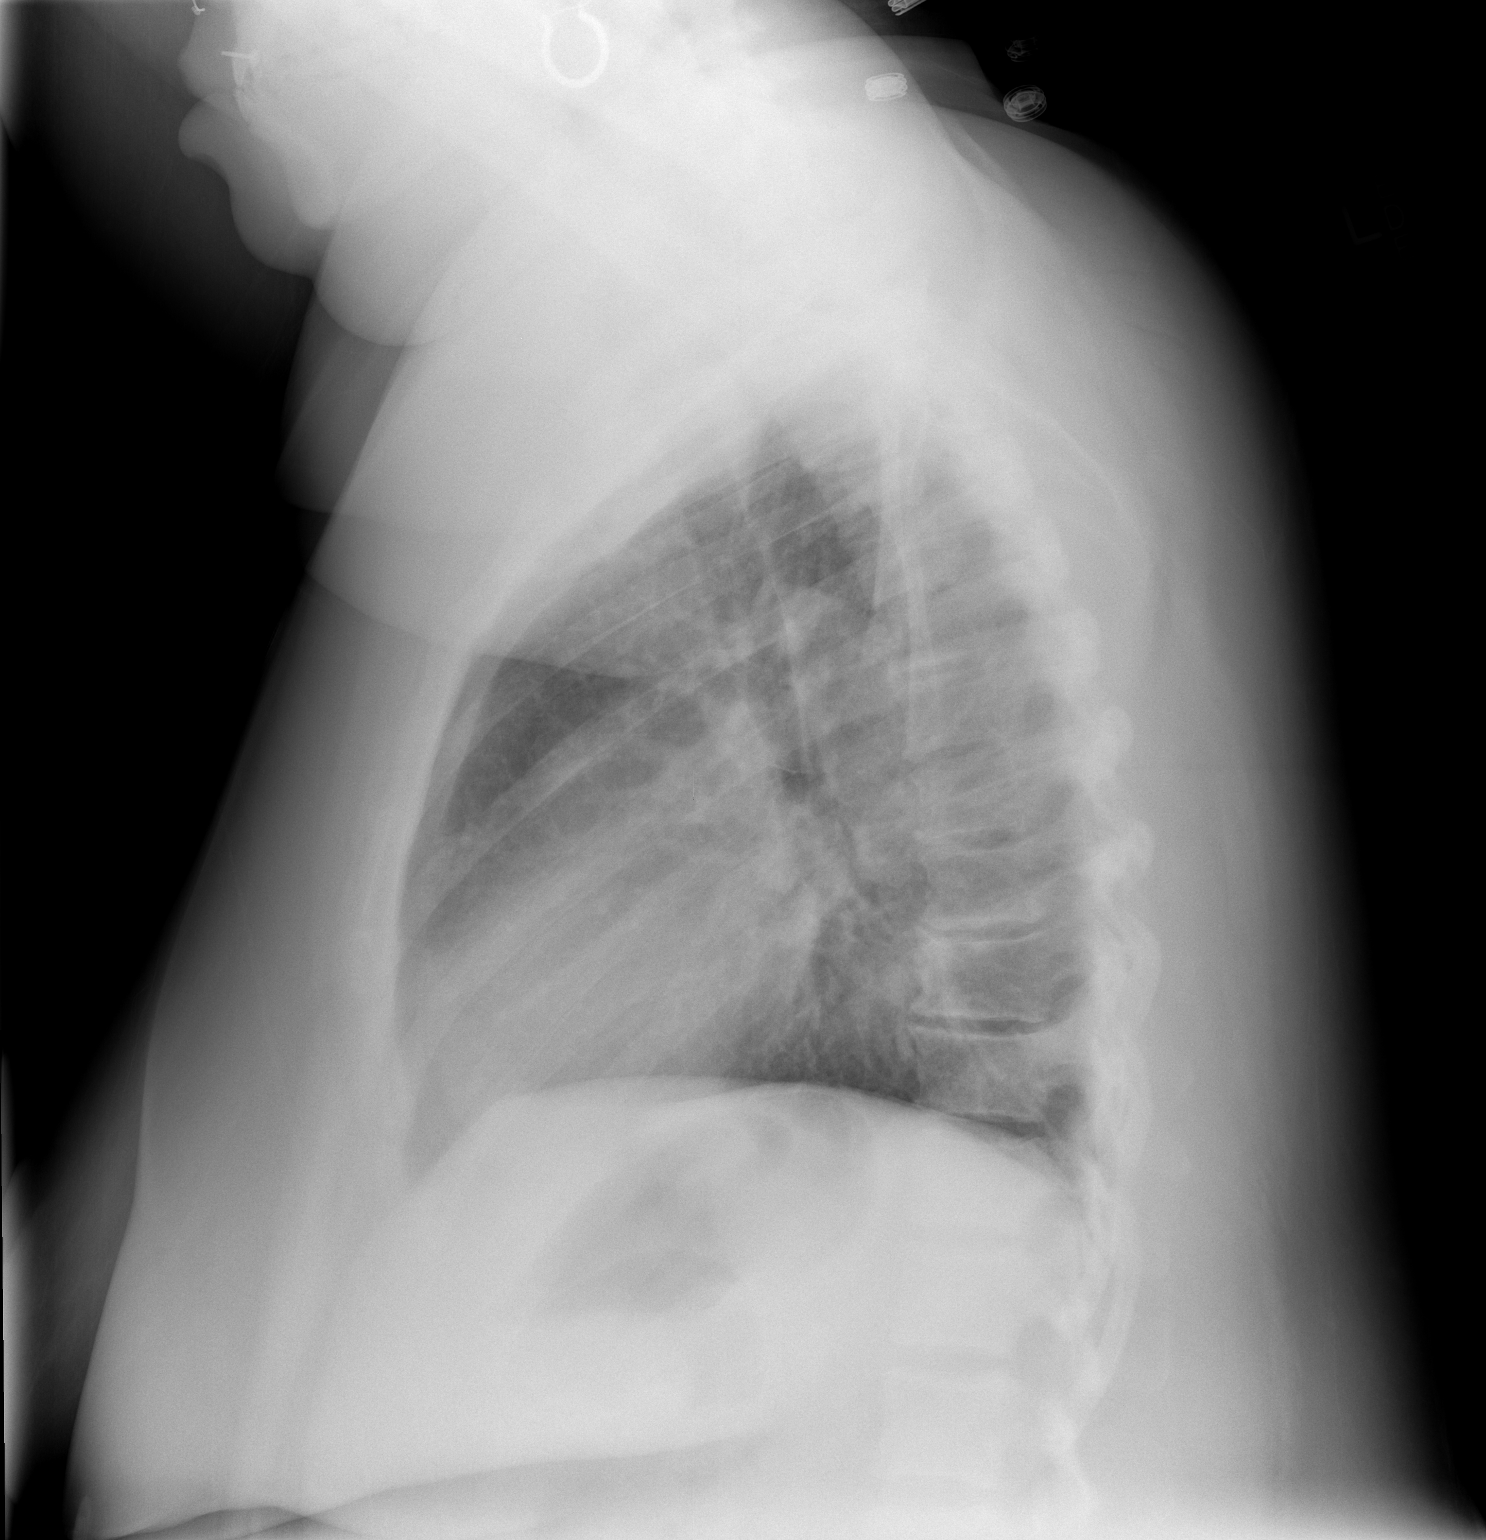

[2 of 2 positions shown; findings below may reference images not displayed]

FINDINGS: The cardiomediastinal silhouette is normal. No focal infiltrate or
edema. No pleural effusion or pneumothorax. No acute osseous
abnormality.
IMPRESSION: No active cardiopulmonary disease.
